# Patient Record
Sex: Female | Born: 1940 | Race: White | Hispanic: No | Marital: Married | Smoking: Never smoker
Health system: Southern US, Community
[De-identification: ages and names within clinical notes are randomized; demographics above are authoritative.]

## PROBLEM LIST (undated history)

## (undated) DIAGNOSIS — J189 Pneumonia, unspecified organism: Secondary | ICD-10-CM

## (undated) DIAGNOSIS — M1990 Unspecified osteoarthritis, unspecified site: Secondary | ICD-10-CM

## (undated) DIAGNOSIS — K5900 Constipation, unspecified: Secondary | ICD-10-CM

## (undated) DIAGNOSIS — I10 Essential (primary) hypertension: Secondary | ICD-10-CM

## (undated) DIAGNOSIS — K219 Gastro-esophageal reflux disease without esophagitis: Secondary | ICD-10-CM

## (undated) DIAGNOSIS — R600 Localized edema: Secondary | ICD-10-CM

## (undated) DIAGNOSIS — E785 Hyperlipidemia, unspecified: Secondary | ICD-10-CM

## (undated) DIAGNOSIS — J42 Unspecified chronic bronchitis: Secondary | ICD-10-CM

## (undated) DIAGNOSIS — R609 Edema, unspecified: Secondary | ICD-10-CM

## (undated) DIAGNOSIS — I739 Peripheral vascular disease, unspecified: Secondary | ICD-10-CM

## (undated) DIAGNOSIS — R6889 Other general symptoms and signs: Secondary | ICD-10-CM

## (undated) DIAGNOSIS — E119 Type 2 diabetes mellitus without complications: Secondary | ICD-10-CM

## (undated) HISTORY — PX: COLONOSCOPY: SHX174

## (undated) HISTORY — PX: DILATION AND CURETTAGE OF UTERUS: SHX78

---

## 1969-12-31 DIAGNOSIS — J189 Pneumonia, unspecified organism: Secondary | ICD-10-CM

## 1969-12-31 HISTORY — DX: Pneumonia, unspecified organism: J18.9

## 1988-12-31 HISTORY — PX: ABDOMINAL HYSTERECTOMY: SHX81

## 1998-06-21 ENCOUNTER — Other Ambulatory Visit: Admission: RE | Admit: 1998-06-21 | Discharge: 1998-06-21 | Payer: Self-pay | Admitting: Gynecology

## 1999-07-21 ENCOUNTER — Other Ambulatory Visit: Admission: RE | Admit: 1999-07-21 | Discharge: 1999-07-21 | Payer: Self-pay | Admitting: Gynecology

## 2000-08-07 ENCOUNTER — Other Ambulatory Visit: Admission: RE | Admit: 2000-08-07 | Discharge: 2000-08-07 | Payer: Self-pay | Admitting: Gynecology

## 2000-08-07 ENCOUNTER — Encounter: Admission: RE | Admit: 2000-08-07 | Discharge: 2000-08-07 | Payer: Self-pay | Admitting: Gynecology

## 2000-08-07 ENCOUNTER — Encounter: Payer: Self-pay | Admitting: Gynecology

## 2001-08-19 ENCOUNTER — Other Ambulatory Visit: Admission: RE | Admit: 2001-08-19 | Discharge: 2001-08-19 | Payer: Self-pay | Admitting: Gynecology

## 2001-08-19 ENCOUNTER — Encounter: Admission: RE | Admit: 2001-08-19 | Discharge: 2001-08-19 | Payer: Self-pay | Admitting: Gynecology

## 2001-08-19 ENCOUNTER — Encounter: Payer: Self-pay | Admitting: Gynecology

## 2002-09-03 ENCOUNTER — Encounter: Payer: Self-pay | Admitting: Gynecology

## 2002-09-03 ENCOUNTER — Other Ambulatory Visit: Admission: RE | Admit: 2002-09-03 | Discharge: 2002-09-03 | Payer: Self-pay | Admitting: Gynecology

## 2002-09-03 ENCOUNTER — Encounter: Admission: RE | Admit: 2002-09-03 | Discharge: 2002-09-03 | Payer: Self-pay | Admitting: Gynecology

## 2003-09-24 ENCOUNTER — Encounter: Admission: RE | Admit: 2003-09-24 | Discharge: 2003-09-24 | Payer: Self-pay | Admitting: Gynecology

## 2003-09-24 ENCOUNTER — Encounter: Payer: Self-pay | Admitting: Gynecology

## 2003-09-24 ENCOUNTER — Other Ambulatory Visit: Admission: RE | Admit: 2003-09-24 | Discharge: 2003-09-24 | Payer: Self-pay | Admitting: Gynecology

## 2004-10-02 ENCOUNTER — Other Ambulatory Visit: Admission: RE | Admit: 2004-10-02 | Discharge: 2004-10-02 | Payer: Self-pay | Admitting: Gynecology

## 2004-10-02 ENCOUNTER — Encounter: Admission: RE | Admit: 2004-10-02 | Discharge: 2004-10-02 | Payer: Self-pay | Admitting: Gynecology

## 2005-10-17 ENCOUNTER — Other Ambulatory Visit: Admission: RE | Admit: 2005-10-17 | Discharge: 2005-10-17 | Payer: Self-pay | Admitting: Gynecology

## 2005-10-17 ENCOUNTER — Encounter: Admission: RE | Admit: 2005-10-17 | Discharge: 2005-10-17 | Payer: Self-pay | Admitting: Gynecology

## 2006-10-28 ENCOUNTER — Other Ambulatory Visit: Admission: RE | Admit: 2006-10-28 | Discharge: 2006-10-28 | Payer: Self-pay | Admitting: Gynecology

## 2006-10-28 ENCOUNTER — Encounter: Admission: RE | Admit: 2006-10-28 | Discharge: 2006-10-28 | Payer: Self-pay | Admitting: Gynecology

## 2007-11-03 ENCOUNTER — Encounter: Admission: RE | Admit: 2007-11-03 | Discharge: 2007-11-03 | Payer: Self-pay | Admitting: Gynecology

## 2007-11-03 ENCOUNTER — Other Ambulatory Visit: Admission: RE | Admit: 2007-11-03 | Discharge: 2007-11-03 | Payer: Self-pay | Admitting: Gynecology

## 2008-12-13 ENCOUNTER — Encounter: Admission: RE | Admit: 2008-12-13 | Discharge: 2008-12-13 | Payer: Self-pay | Admitting: Gynecology

## 2010-01-02 ENCOUNTER — Encounter: Admission: RE | Admit: 2010-01-02 | Discharge: 2010-01-02 | Payer: Self-pay | Admitting: Gynecology

## 2011-01-15 ENCOUNTER — Encounter
Admission: RE | Admit: 2011-01-15 | Discharge: 2011-01-15 | Payer: Self-pay | Source: Home / Self Care | Attending: Gynecology | Admitting: Gynecology

## 2011-07-12 ENCOUNTER — Other Ambulatory Visit: Payer: Self-pay | Admitting: Internal Medicine

## 2011-07-12 DIAGNOSIS — I83899 Varicose veins of unspecified lower extremities with other complications: Secondary | ICD-10-CM

## 2011-07-18 ENCOUNTER — Ambulatory Visit
Admission: RE | Admit: 2011-07-18 | Discharge: 2011-07-18 | Disposition: A | Discharge status: Home / Self Care | Payer: Medicare Other | Source: Ambulatory Visit | Attending: Internal Medicine | Admitting: Internal Medicine

## 2011-07-18 VITALS — BP 160/80 | HR 71 | Temp 98.3°F | Resp 16 | Ht 61.0 in | Wt 199.0 lb

## 2011-07-18 DIAGNOSIS — I83899 Varicose veins of unspecified lower extremities with other complications: Secondary | ICD-10-CM

## 2011-07-18 HISTORY — DX: Localized edema: R60.0

## 2011-07-18 HISTORY — DX: Other general symptoms and signs: R68.89

## 2011-07-18 HISTORY — DX: Essential (primary) hypertension: I10

## 2011-07-18 HISTORY — DX: Edema, unspecified: R60.9

## 2011-07-18 HISTORY — DX: Hyperlipidemia, unspecified: E78.5

## 2012-01-21 DIAGNOSIS — E119 Type 2 diabetes mellitus without complications: Secondary | ICD-10-CM | POA: Diagnosis not present

## 2012-01-21 DIAGNOSIS — E785 Hyperlipidemia, unspecified: Secondary | ICD-10-CM | POA: Diagnosis not present

## 2012-01-21 DIAGNOSIS — Z79899 Other long term (current) drug therapy: Secondary | ICD-10-CM | POA: Diagnosis not present

## 2012-01-21 DIAGNOSIS — Z6836 Body mass index (BMI) 36.0-36.9, adult: Secondary | ICD-10-CM | POA: Diagnosis not present

## 2012-01-21 DIAGNOSIS — I872 Venous insufficiency (chronic) (peripheral): Secondary | ICD-10-CM | POA: Diagnosis not present

## 2012-01-21 DIAGNOSIS — I10 Essential (primary) hypertension: Secondary | ICD-10-CM | POA: Diagnosis not present

## 2012-01-31 ENCOUNTER — Other Ambulatory Visit: Payer: Self-pay | Admitting: Gynecology

## 2012-01-31 DIAGNOSIS — Z1231 Encounter for screening mammogram for malignant neoplasm of breast: Secondary | ICD-10-CM

## 2012-02-11 ENCOUNTER — Ambulatory Visit
Admission: RE | Admit: 2012-02-11 | Discharge: 2012-02-11 | Disposition: A | Discharge status: Home / Self Care | Payer: Medicare Other | Source: Ambulatory Visit | Attending: Gynecology | Admitting: Gynecology

## 2012-02-11 DIAGNOSIS — Z1231 Encounter for screening mammogram for malignant neoplasm of breast: Secondary | ICD-10-CM | POA: Diagnosis not present

## 2012-02-11 DIAGNOSIS — N8184 Pelvic muscle wasting: Secondary | ICD-10-CM | POA: Diagnosis not present

## 2012-02-11 DIAGNOSIS — Z01419 Encounter for gynecological examination (general) (routine) without abnormal findings: Secondary | ICD-10-CM | POA: Diagnosis not present

## 2012-02-11 DIAGNOSIS — M949 Disorder of cartilage, unspecified: Secondary | ICD-10-CM | POA: Diagnosis not present

## 2012-02-11 DIAGNOSIS — M899 Disorder of bone, unspecified: Secondary | ICD-10-CM | POA: Diagnosis not present

## 2012-02-11 DIAGNOSIS — E119 Type 2 diabetes mellitus without complications: Secondary | ICD-10-CM | POA: Diagnosis not present

## 2012-04-21 DIAGNOSIS — Z6836 Body mass index (BMI) 36.0-36.9, adult: Secondary | ICD-10-CM | POA: Diagnosis not present

## 2012-04-21 DIAGNOSIS — Z79899 Other long term (current) drug therapy: Secondary | ICD-10-CM | POA: Diagnosis not present

## 2012-04-21 DIAGNOSIS — E785 Hyperlipidemia, unspecified: Secondary | ICD-10-CM | POA: Diagnosis not present

## 2012-04-21 DIAGNOSIS — E119 Type 2 diabetes mellitus without complications: Secondary | ICD-10-CM | POA: Diagnosis not present

## 2012-04-21 DIAGNOSIS — I872 Venous insufficiency (chronic) (peripheral): Secondary | ICD-10-CM | POA: Diagnosis not present

## 2012-04-21 DIAGNOSIS — I10 Essential (primary) hypertension: Secondary | ICD-10-CM | POA: Diagnosis not present

## 2012-06-09 DIAGNOSIS — M25569 Pain in unspecified knee: Secondary | ICD-10-CM | POA: Diagnosis not present

## 2012-06-09 DIAGNOSIS — M1710 Unilateral primary osteoarthritis, unspecified knee: Secondary | ICD-10-CM | POA: Diagnosis not present

## 2012-06-23 DIAGNOSIS — M1710 Unilateral primary osteoarthritis, unspecified knee: Secondary | ICD-10-CM | POA: Diagnosis not present

## 2012-06-30 DIAGNOSIS — M1710 Unilateral primary osteoarthritis, unspecified knee: Secondary | ICD-10-CM | POA: Diagnosis not present

## 2012-06-30 DIAGNOSIS — M25559 Pain in unspecified hip: Secondary | ICD-10-CM | POA: Diagnosis not present

## 2012-07-07 DIAGNOSIS — M1710 Unilateral primary osteoarthritis, unspecified knee: Secondary | ICD-10-CM | POA: Diagnosis not present

## 2012-07-28 DIAGNOSIS — Z6837 Body mass index (BMI) 37.0-37.9, adult: Secondary | ICD-10-CM | POA: Diagnosis not present

## 2012-07-28 DIAGNOSIS — I872 Venous insufficiency (chronic) (peripheral): Secondary | ICD-10-CM | POA: Diagnosis not present

## 2012-07-28 DIAGNOSIS — I10 Essential (primary) hypertension: Secondary | ICD-10-CM | POA: Diagnosis not present

## 2012-07-28 DIAGNOSIS — E119 Type 2 diabetes mellitus without complications: Secondary | ICD-10-CM | POA: Diagnosis not present

## 2012-07-28 DIAGNOSIS — E785 Hyperlipidemia, unspecified: Secondary | ICD-10-CM | POA: Diagnosis not present

## 2012-08-11 DIAGNOSIS — M1610 Unilateral primary osteoarthritis, unspecified hip: Secondary | ICD-10-CM | POA: Diagnosis not present

## 2012-08-16 DIAGNOSIS — M169 Osteoarthritis of hip, unspecified: Secondary | ICD-10-CM | POA: Diagnosis not present

## 2012-08-18 ENCOUNTER — Other Ambulatory Visit: Payer: Self-pay | Admitting: Orthopedic Surgery

## 2012-08-29 ENCOUNTER — Encounter (HOSPITAL_COMMUNITY): Payer: Self-pay

## 2012-09-08 NOTE — Pre-Procedure Instructions (Signed)
20 Michele Stewart  09/08/2012   Your procedure is scheduled on:  Monday September 15, 2012.  Report to [facility] at 0800 AM.  Call this number if you have problems the morning of surgery: [phone]   Remember:   Do not eat food or drink:After Midnight.    Take these medicines the morning of surgery with A SIP OF WATER: Amlodipine (Norvasc), Bisoprolol (Ziac), Famotidine (Pepcid)   Do not wear jewelry, make-up or nail polish.  Do not wear lotions, powders, or perfumes.   Do not shave 48 hours prior to surgery.   Do not bring valuables to the hospital.  Contacts, dentures or bridgework may not be worn into surgery.  Leave suitcase in the car. After surgery it may be brought to your room.  For patients admitted to the hospital, checkout time is 11:00 AM the day of discharge.   Patients discharged the day of surgery will not be allowed to drive home.  Name and phone number of your driver:   Special Instructions: CHG Shower Use Special Wash: 1/2 bottle night before surgery and 1/2 bottle morning of surgery.   Please read over the following fact sheets that you were given: Pain Booklet, Coughing and Deep Breathing, Blood Transfusion Information, Total Joint Packet, MRSA Information and Surgical Site Infection Prevention

## 2012-09-09 ENCOUNTER — Encounter (HOSPITAL_COMMUNITY): Payer: Self-pay

## 2012-09-09 ENCOUNTER — Encounter (HOSPITAL_COMMUNITY)
Admission: RE | Admit: 2012-09-09 | Discharge: 2012-09-09 | Disposition: A | Discharge status: Home / Self Care | Payer: Medicare Other | Source: Ambulatory Visit | Attending: Orthopedic Surgery | Admitting: Orthopedic Surgery

## 2012-09-09 DIAGNOSIS — E785 Hyperlipidemia, unspecified: Secondary | ICD-10-CM | POA: Diagnosis not present

## 2012-09-09 DIAGNOSIS — Z01811 Encounter for preprocedural respiratory examination: Secondary | ICD-10-CM | POA: Diagnosis not present

## 2012-09-09 DIAGNOSIS — N390 Urinary tract infection, site not specified: Secondary | ICD-10-CM | POA: Diagnosis not present

## 2012-09-09 DIAGNOSIS — E119 Type 2 diabetes mellitus without complications: Secondary | ICD-10-CM | POA: Diagnosis not present

## 2012-09-09 DIAGNOSIS — M169 Osteoarthritis of hip, unspecified: Secondary | ICD-10-CM | POA: Diagnosis not present

## 2012-09-09 HISTORY — DX: Unspecified osteoarthritis, unspecified site: M19.90

## 2012-09-09 HISTORY — DX: Constipation, unspecified: K59.00

## 2012-09-09 HISTORY — DX: Pneumonia, unspecified organism: J18.9

## 2012-09-09 HISTORY — DX: Gastro-esophageal reflux disease without esophagitis: K21.9

## 2012-09-09 LAB — CBC WITH DIFFERENTIAL/PLATELET
Basophils Absolute: 0.1 10*3/uL (ref 0.0–0.1)
HCT: 35.6 % — ABNORMAL LOW (ref 36.0–46.0)
Hemoglobin: 12 g/dL (ref 12.0–15.0)
Lymphocytes Relative: 14 % (ref 12–46)
Monocytes Absolute: 0.8 10*3/uL (ref 0.1–1.0)
Monocytes Relative: 8 % (ref 3–12)
Neutro Abs: 6.8 10*3/uL (ref 1.7–7.7)
WBC: 9.8 10*3/uL (ref 4.0–10.5)

## 2012-09-09 LAB — URINALYSIS, ROUTINE W REFLEX MICROSCOPIC
Hgb urine dipstick: NEGATIVE
Protein, ur: NEGATIVE mg/dL
Urobilinogen, UA: 0.2 mg/dL (ref 0.0–1.0)

## 2012-09-09 LAB — TYPE AND SCREEN
ABO/RH(D): O POS
Antibody Screen: NEGATIVE

## 2012-09-09 LAB — SURGICAL PCR SCREEN: MRSA, PCR: NEGATIVE

## 2012-09-09 LAB — BASIC METABOLIC PANEL
Chloride: 100 mEq/L (ref 96–112)
GFR calc Af Amer: 45 mL/min — ABNORMAL LOW (ref >90)
Potassium: 3.4 mEq/L — ABNORMAL LOW (ref 3.5–5.1)

## 2012-09-09 LAB — ABO/RH: ABO/RH(D): O POS

## 2012-09-09 LAB — APTT: aPTT: 35 seconds (ref 24–37)

## 2012-09-09 NOTE — Progress Notes (Signed)
Patient informed Nurse that she had a stress test at [facility] in [place], [place]. Records requested. Patient denied having a cardiac cath or sleep study.

## 2012-09-12 NOTE — Progress Notes (Signed)
51 Am  ---Spoke with patient ---arriving at [facility] and understands.Marland KitchenMarland KitchenDA

## 2012-09-13 NOTE — H&P (Signed)
Michele Stewart is an 71 y.o. female.   Chief Complaint: Left hip pain HPI: Michele Stewart is here in consultation from Dr. 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  She is from [place] x-rays from a little over a year ago show very little arthritis, but more recent ones shot this year show complete loss of articular cartilage with subchondral cyst and early erosion of the femoral head.  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  Her hip pain wakes her up at night, causes her to limp all of the time and is starting to interfere with chores around the house, specifically cooking and cleaning.  She has received cortisone injections into the hip in the past year that it provided at best temporary relief.  Past Medical History  Diagnosis Date  . Diabetes mellitus   . Peripheral edema   . Hyperlipidemia   . Hypertension     Benign Essential Hypertension  . Cold intolerance   . Pneumonia     hx of  . Bronchitis   . GERD (gastroesophageal reflux disease)   . Constipation   . Arthritis     Past Surgical History  Procedure Date  . Abdominal hysterectomy 1990  . Cesarean section     2 c-sectons  . Colonoscopy     No family history on file. Social History:  reports that she has never smoked. She has never used smokeless tobacco. She reports that she does not drink alcohol or use illicit drugs.  Allergies:  Allergies  Allergen Reactions  . Penicillins Rash  . Shellfish Allergy Itching and Rash  . Sulfa Antibiotics Hives and Rash    No prescriptions prior to admission    No results found for this or any previous visit (from the past 48 hour(s)). No results found.  Review of Systems  Constitutional: Negative.   Musculoskeletal: Positive for joint pain.  All other systems reviewed and are negative.    There were no vitals taken for this visit. Physical Exam  Constitutional: She is oriented to  person, place, and time. She appears well-developed and well-nourished.  Eyes: Pupils are equal, round, and reactive to light.  Cardiovascular: Normal heart sounds.   Respiratory: Breath sounds normal.  GI: Soft.  Musculoskeletal: She exhibits tenderness.  Neurological: She is alert and oriented to person, place, and time.    Internal rotation of the left hip blocks at -5 and causes severe pain.  Her left knee lacks 10 of full extension, bringing her into extension also causes pain but much less than motion of the hip.  The x-rays are reviewed with the patient.  The skin over the hip in the near intact.  Although she is a non-insulin-dependent diabetic she has normal sensation to her feet and normal pulses.  Assessment/Plan Assess: Progressive osteoarthritis of the left hip with severe symptoms and x-ray showing bone-on-bone with subchondral cystic changes and early flattening of the femoral head.  Plan: Models were brought into the room and we had about a 30 min. conversation about the risks and benefits of hip replacement, the surgery itself, the expected postoperative course.  I filled out a surgery sheet for Michele Stewart to assist in scheduling surgery with the patient.  I will see her back at the time of surgical intervention.  Veryl Abril M. 09/13/2012, 9:05 AM

## 2012-09-14 MED ORDER — VANCOMYCIN HCL IN DEXTROSE 1-5 GM/200ML-% IV SOLN
1000.0000 mg | INTRAVENOUS | Status: AC
  Administered 2012-09-15: 1000 mg via INTRAVENOUS

## 2012-09-15 ENCOUNTER — Encounter (HOSPITAL_COMMUNITY): Payer: Self-pay | Admitting: Certified Registered"

## 2012-09-15 ENCOUNTER — Inpatient Hospital Stay (HOSPITAL_COMMUNITY)
Admission: RE | Admit: 2012-09-15 | Discharge: 2012-09-17 | DRG: 470 | Disposition: A | Discharge status: Home Health | Payer: Medicare Other | Source: Ambulatory Visit | Attending: Orthopedic Surgery | Admitting: Orthopedic Surgery

## 2012-09-15 ENCOUNTER — Inpatient Hospital Stay (HOSPITAL_COMMUNITY): Payer: Medicare Other

## 2012-09-15 ENCOUNTER — Encounter (HOSPITAL_COMMUNITY): Payer: Self-pay | Admitting: *Deleted

## 2012-09-15 ENCOUNTER — Inpatient Hospital Stay (HOSPITAL_COMMUNITY): Payer: Medicare Other | Admitting: Certified Registered"

## 2012-09-15 ENCOUNTER — Encounter (HOSPITAL_COMMUNITY)
Admission: RE | Disposition: A | Discharge status: Home Health | Payer: Self-pay | Source: Ambulatory Visit | Attending: Orthopedic Surgery

## 2012-09-15 DIAGNOSIS — E785 Hyperlipidemia, unspecified: Secondary | ICD-10-CM | POA: Diagnosis not present

## 2012-09-15 DIAGNOSIS — M1610 Unilateral primary osteoarthritis, unspecified hip: Secondary | ICD-10-CM | POA: Diagnosis not present

## 2012-09-15 DIAGNOSIS — Z6836 Body mass index (BMI) 36.0-36.9, adult: Secondary | ICD-10-CM | POA: Diagnosis not present

## 2012-09-15 DIAGNOSIS — I10 Essential (primary) hypertension: Secondary | ICD-10-CM | POA: Diagnosis present

## 2012-09-15 DIAGNOSIS — M169 Osteoarthritis of hip, unspecified: Secondary | ICD-10-CM | POA: Diagnosis not present

## 2012-09-15 DIAGNOSIS — Z96649 Presence of unspecified artificial hip joint: Secondary | ICD-10-CM | POA: Diagnosis not present

## 2012-09-15 DIAGNOSIS — K219 Gastro-esophageal reflux disease without esophagitis: Secondary | ICD-10-CM | POA: Diagnosis not present

## 2012-09-15 DIAGNOSIS — M1612 Unilateral primary osteoarthritis, left hip: Secondary | ICD-10-CM | POA: Diagnosis present

## 2012-09-15 DIAGNOSIS — M25559 Pain in unspecified hip: Secondary | ICD-10-CM | POA: Diagnosis not present

## 2012-09-15 DIAGNOSIS — Z471 Aftercare following joint replacement surgery: Secondary | ICD-10-CM | POA: Diagnosis not present

## 2012-09-15 DIAGNOSIS — N390 Urinary tract infection, site not specified: Secondary | ICD-10-CM | POA: Diagnosis not present

## 2012-09-15 DIAGNOSIS — E119 Type 2 diabetes mellitus without complications: Secondary | ICD-10-CM | POA: Diagnosis not present

## 2012-09-15 DIAGNOSIS — E669 Obesity, unspecified: Secondary | ICD-10-CM | POA: Diagnosis present

## 2012-09-15 HISTORY — PX: TOTAL HIP ARTHROPLASTY: SHX124

## 2012-09-15 HISTORY — DX: Type 2 diabetes mellitus without complications: E11.9

## 2012-09-15 HISTORY — DX: Unspecified chronic bronchitis: J42

## 2012-09-15 HISTORY — DX: Peripheral vascular disease, unspecified: I73.9

## 2012-09-15 LAB — GLUCOSE, CAPILLARY
Glucose-Capillary: 122 mg/dL — ABNORMAL HIGH (ref 70–99)
Glucose-Capillary: 167 mg/dL — ABNORMAL HIGH (ref 70–99)
Glucose-Capillary: 245 mg/dL — ABNORMAL HIGH (ref 70–99)

## 2012-09-15 SURGERY — ARTHROPLASTY, HIP, TOTAL,POSTERIOR APPROACH
Anesthesia: General | Site: Hip | Laterality: Left | Wound class: Clean

## 2012-09-15 MED ORDER — LORATADINE 10 MG PO TABS
10.0000 mg | ORAL_TABLET | Freq: Every day | ORAL | Status: DC
  Administered 2012-09-15 – 2012-09-17 (×3): 10 mg via ORAL
  Filled 2012-09-15 (×3): qty 1

## 2012-09-15 MED ORDER — ONDANSETRON HCL 4 MG/2ML IJ SOLN: 4.0000 mg | Freq: Four times a day (QID) | INTRAMUSCULAR | Status: DC | PRN

## 2012-09-15 MED ORDER — LINAGLIPTIN 5 MG PO TABS
5.0000 mg | ORAL_TABLET | Freq: Every day | ORAL | Status: DC
  Administered 2012-09-15 – 2012-09-17 (×3): 5 mg via ORAL
  Filled 2012-09-15 (×3): qty 1

## 2012-09-15 MED ORDER — PSYLLIUM 0.52 G PO CAPS
0.5200 g | ORAL_CAPSULE | Freq: Three times a day (TID) | ORAL | Status: DC
Start: 2012-09-15 — End: 2012-09-15

## 2012-09-15 MED ORDER — LIDOCAINE HCL (CARDIAC) 20 MG/ML IV SOLN
INTRAVENOUS | Status: DC | PRN
  Administered 2012-09-15: 100 mg via INTRAVENOUS

## 2012-09-15 MED ORDER — BISOPROLOL-HYDROCHLOROTHIAZIDE 10-6.25 MG PO TABS
1.0000 | ORAL_TABLET | Freq: Every day | ORAL | Status: DC
  Administered 2012-09-16: 1 via ORAL
  Filled 2012-09-15 (×3): qty 1

## 2012-09-15 MED ORDER — EPHEDRINE SULFATE 50 MG/ML IJ SOLN
INTRAMUSCULAR | Status: DC | PRN
  Administered 2012-09-15: 10 mg via INTRAVENOUS
  Administered 2012-09-15: 5 mg via INTRAVENOUS
  Administered 2012-09-15: 10 mg via INTRAVENOUS

## 2012-09-15 MED ORDER — BISACODYL 5 MG PO TBEC: 5.0000 mg | DELAYED_RELEASE_TABLET | Freq: Every day | ORAL | Status: DC | PRN

## 2012-09-15 MED ORDER — PHENOL 1.4 % MT LIQD: 1.0000 | OROMUCOSAL | Status: DC | PRN

## 2012-09-15 MED ORDER — MAGNESIUM HYDROXIDE 400 MG/5ML PO SUSP: 30.0000 mL | Freq: Every day | ORAL | Status: DC | PRN

## 2012-09-15 MED ORDER — SUFENTANIL CITRATE 50 MCG/ML IV SOLN
INTRAVENOUS | Status: DC | PRN
  Administered 2012-09-15 (×2): 10 ug via INTRAVENOUS

## 2012-09-15 MED ORDER — AMLODIPINE BESYLATE 10 MG PO TABS
10.0000 mg | ORAL_TABLET | Freq: Every day | ORAL | Status: DC
  Administered 2012-09-16: 10 mg via ORAL
  Filled 2012-09-15 (×3): qty 1

## 2012-09-15 MED ORDER — ATORVASTATIN CALCIUM 40 MG PO TABS
40.0000 mg | ORAL_TABLET | Freq: Every day | ORAL | Status: DC
  Administered 2012-09-15 – 2012-09-16 (×2): 40 mg via ORAL
  Filled 2012-09-15 (×3): qty 1

## 2012-09-15 MED ORDER — METHOCARBAMOL 500 MG PO TABS
500.0000 mg | ORAL_TABLET | Freq: Four times a day (QID) | ORAL | Status: DC | PRN
  Administered 2012-09-15: 500 mg via ORAL

## 2012-09-15 MED ORDER — ACETAMINOPHEN 325 MG PO TABS: 650.0000 mg | ORAL_TABLET | Freq: Four times a day (QID) | ORAL | Status: DC | PRN

## 2012-09-15 MED ORDER — FUROSEMIDE 80 MG PO TABS
80.0000 mg | ORAL_TABLET | Freq: Every day | ORAL | Status: DC
  Administered 2012-09-15 – 2012-09-16 (×2): 80 mg via ORAL
  Filled 2012-09-15 (×3): qty 1

## 2012-09-15 MED ORDER — ACETAMINOPHEN 10 MG/ML IV SOLN
INTRAVENOUS | Status: DC | PRN
  Administered 2012-09-15: 1000 mg via INTRAVENOUS

## 2012-09-15 MED ORDER — ACETAMINOPHEN 650 MG RE SUPP: 650.0000 mg | Freq: Four times a day (QID) | RECTAL | Status: DC | PRN

## 2012-09-15 MED ORDER — ASPIRIN EC 325 MG PO TBEC
325.0000 mg | DELAYED_RELEASE_TABLET | Freq: Two times a day (BID) | ORAL | Status: DC
  Administered 2012-09-15 – 2012-09-17 (×5): 325 mg via ORAL
  Filled 2012-09-15 (×6): qty 1

## 2012-09-15 MED ORDER — ROCURONIUM BROMIDE 100 MG/10ML IV SOLN
INTRAVENOUS | Status: DC | PRN
  Administered 2012-09-15: 50 mg via INTRAVENOUS

## 2012-09-15 MED ORDER — IRBESARTAN 300 MG PO TABS
300.0000 mg | ORAL_TABLET | Freq: Every day | ORAL | Status: DC
  Administered 2012-09-15 – 2012-09-16 (×2): 300 mg via ORAL
  Filled 2012-09-15 (×3): qty 1

## 2012-09-15 MED ORDER — HYDROMORPHONE HCL PF 1 MG/ML IJ SOLN
0.2500 mg | INTRAMUSCULAR | Status: DC | PRN
  Administered 2012-09-15 (×2): 0.5 mg via INTRAVENOUS

## 2012-09-15 MED ORDER — LISINOPRIL 20 MG PO TABS
20.0000 mg | ORAL_TABLET | Freq: Every day | ORAL | Status: DC
  Filled 2012-09-15: qty 1

## 2012-09-15 MED ORDER — OXYCODONE HCL 5 MG PO TABS
ORAL_TABLET | ORAL | Status: AC
  Filled 2012-09-15: qty 2

## 2012-09-15 MED ORDER — DEXTROSE-NACL 5-0.45 % IV SOLN: INTRAVENOUS | Status: DC

## 2012-09-15 MED ORDER — LIDOCAINE HCL 4 % MT SOLN
OROMUCOSAL | Status: DC | PRN
  Administered 2012-09-15: 4 mL via TOPICAL

## 2012-09-15 MED ORDER — HYDROMORPHONE HCL PF 1 MG/ML IJ SOLN
INTRAMUSCULAR | Status: AC
  Filled 2012-09-15: qty 1

## 2012-09-15 MED ORDER — OXYCODONE HCL 5 MG PO TABS
5.0000 mg | ORAL_TABLET | ORAL | Status: DC | PRN
  Administered 2012-09-15: 5 mg via ORAL
  Administered 2012-09-15: 10 mg via ORAL
  Administered 2012-09-16 – 2012-09-17 (×3): 5 mg via ORAL
  Filled 2012-09-15: qty 1
  Filled 2012-09-15: qty 2
  Filled 2012-09-15 (×2): qty 1

## 2012-09-15 MED ORDER — ALUM & MAG HYDROXIDE-SIMETH 200-200-20 MG/5ML PO SUSP: 30.0000 mL | ORAL | Status: DC | PRN

## 2012-09-15 MED ORDER — ACETAMINOPHEN 10 MG/ML IV SOLN
INTRAVENOUS | Status: AC
  Filled 2012-09-15: qty 100

## 2012-09-15 MED ORDER — CHLORHEXIDINE GLUCONATE 4 % EX LIQD: 60.0000 mL | Freq: Once | CUTANEOUS | Status: DC

## 2012-09-15 MED ORDER — MENTHOL 3 MG MT LOZG: 1.0000 | LOZENGE | OROMUCOSAL | Status: DC | PRN

## 2012-09-15 MED ORDER — METHOCARBAMOL 100 MG/ML IJ SOLN
500.0000 mg | Freq: Four times a day (QID) | INTRAVENOUS | Status: DC | PRN
  Filled 2012-09-15: qty 5

## 2012-09-15 MED ORDER — BUPIVACAINE-EPINEPHRINE 0.5% -1:200000 IJ SOLN
INTRAMUSCULAR | Status: DC | PRN
  Administered 2012-09-15: 24 mL

## 2012-09-15 MED ORDER — POTASSIUM CHLORIDE CRYS ER 10 MEQ PO TBCR
10.0000 meq | EXTENDED_RELEASE_TABLET | Freq: Two times a day (BID) | ORAL | Status: DC
  Administered 2012-09-15 – 2012-09-17 (×4): 10 meq via ORAL
  Filled 2012-09-15 (×5): qty 1

## 2012-09-15 MED ORDER — KCL IN DEXTROSE-NACL 20-5-0.45 MEQ/L-%-% IV SOLN
INTRAVENOUS | Status: DC
  Administered 2012-09-15 – 2012-09-16 (×2): via INTRAVENOUS
  Filled 2012-09-15 (×8): qty 1000

## 2012-09-15 MED ORDER — LACTATED RINGERS IV SOLN
INTRAVENOUS | Status: DC | PRN
  Administered 2012-09-15 (×2): via INTRAVENOUS

## 2012-09-15 MED ORDER — LISINOPRIL 20 MG PO TABS
20.0000 mg | ORAL_TABLET | Freq: Every day | ORAL | Status: DC
  Administered 2012-09-15: 20 mg via ORAL
  Filled 2012-09-15 (×3): qty 1

## 2012-09-15 MED ORDER — ONDANSETRON HCL 4 MG PO TABS: 4.0000 mg | ORAL_TABLET | Freq: Four times a day (QID) | ORAL | Status: DC | PRN

## 2012-09-15 MED ORDER — DIPHENHYDRAMINE HCL 12.5 MG/5ML PO ELIX: 12.5000 mg | ORAL_SOLUTION | ORAL | Status: DC | PRN

## 2012-09-15 MED ORDER — PSYLLIUM 95 % PO PACK
1.0000 | PACK | Freq: Three times a day (TID) | ORAL | Status: DC
  Administered 2012-09-15 – 2012-09-17 (×4): 1 via ORAL
  Filled 2012-09-15 (×8): qty 1

## 2012-09-15 MED ORDER — METOCLOPRAMIDE HCL 10 MG PO TABS: 5.0000 mg | ORAL_TABLET | Freq: Three times a day (TID) | ORAL | Status: DC | PRN

## 2012-09-15 MED ORDER — ESTRADIOL 0.05 MG/24HR TD PTWK
0.0500 mg | MEDICATED_PATCH | TRANSDERMAL | Status: DC
  Administered 2012-09-15: 0.05 mg via TRANSDERMAL
  Filled 2012-09-15: qty 1

## 2012-09-15 MED ORDER — INFLUENZA VIRUS VACC SPLIT PF IM SUSP
0.5000 mL | INTRAMUSCULAR | Status: AC
  Filled 2012-09-15: qty 0.5

## 2012-09-15 MED ORDER — INSULIN ASPART 100 UNIT/ML ~~LOC~~ SOLN
0.0000 [IU] | Freq: Three times a day (TID) | SUBCUTANEOUS | Status: DC
  Administered 2012-09-15 – 2012-09-16 (×2): 3 [IU] via SUBCUTANEOUS
  Administered 2012-09-16: 2 [IU] via SUBCUTANEOUS
  Administered 2012-09-16 – 2012-09-17 (×2): 3 [IU] via SUBCUTANEOUS

## 2012-09-15 MED ORDER — BISOPROLOL-HYDROCHLOROTHIAZIDE 10-6.25 MG PO TABS: 1.0000 | ORAL_TABLET | Freq: Every day | ORAL | Status: DC

## 2012-09-15 MED ORDER — MIDAZOLAM HCL 5 MG/5ML IJ SOLN
INTRAMUSCULAR | Status: DC | PRN
  Administered 2012-09-15: 2 mg via INTRAVENOUS

## 2012-09-15 MED ORDER — FAMOTIDINE 20 MG PO TABS
20.0000 mg | ORAL_TABLET | Freq: Every day | ORAL | Status: DC
  Administered 2012-09-16 – 2012-09-17 (×2): 20 mg via ORAL
  Filled 2012-09-15 (×2): qty 1

## 2012-09-15 MED ORDER — PNEUMOCOCCAL VAC POLYVALENT 25 MCG/0.5ML IJ INJ
0.5000 mL | INJECTION | INTRAMUSCULAR | Status: AC
  Filled 2012-09-15: qty 0.5

## 2012-09-15 MED ORDER — METOCLOPRAMIDE HCL 5 MG/ML IJ SOLN: 5.0000 mg | Freq: Three times a day (TID) | INTRAMUSCULAR | Status: DC | PRN

## 2012-09-15 MED ORDER — BUPIVACAINE-EPINEPHRINE (PF) 0.5% -1:200000 IJ SOLN
INTRAMUSCULAR | Status: AC
Start: 2012-09-15 — End: 2012-09-15
  Filled 2012-09-15: qty 10

## 2012-09-15 MED ORDER — ACETAMINOPHEN 10 MG/ML IV SOLN
1000.0000 mg | Freq: Four times a day (QID) | INTRAVENOUS | Status: AC
  Administered 2012-09-15 – 2012-09-16 (×4): 1000 mg via INTRAVENOUS
  Filled 2012-09-15 (×4): qty 100

## 2012-09-15 MED ORDER — GEMFIBROZIL 600 MG PO TABS
600.0000 mg | ORAL_TABLET | Freq: Every day | ORAL | Status: DC
  Administered 2012-09-15 – 2012-09-16 (×2): 600 mg via ORAL
  Filled 2012-09-15 (×3): qty 1

## 2012-09-15 MED ORDER — GEMFIBROZIL 600 MG PO TABS
600.0000 mg | ORAL_TABLET | Freq: Every day | ORAL | Status: DC
  Filled 2012-09-15: qty 1

## 2012-09-15 MED ORDER — HYDROMORPHONE HCL PF 1 MG/ML IJ SOLN: 1.0000 mg | INTRAMUSCULAR | Status: DC | PRN

## 2012-09-15 MED ORDER — METHOCARBAMOL 500 MG PO TABS
ORAL_TABLET | ORAL | Status: AC
  Filled 2012-09-15: qty 1

## 2012-09-15 MED ORDER — FLEET ENEMA 7-19 GM/118ML RE ENEM: 1.0000 | ENEMA | Freq: Once | RECTAL | Status: AC | PRN

## 2012-09-15 MED ORDER — PROPOFOL 10 MG/ML IV BOLUS
INTRAVENOUS | Status: DC | PRN
  Administered 2012-09-15: 150 mg via INTRAVENOUS

## 2012-09-15 MED ORDER — DROPERIDOL 2.5 MG/ML IJ SOLN
INTRAMUSCULAR | Status: DC | PRN
  Administered 2012-09-15: 0.625 mg via INTRAVENOUS

## 2012-09-15 MED ORDER — ONDANSETRON HCL 4 MG/2ML IJ SOLN
INTRAMUSCULAR | Status: DC | PRN
  Administered 2012-09-15: 4 mg via INTRAVENOUS

## 2012-09-15 MED ORDER — ZOLPIDEM TARTRATE 5 MG PO TABS: 5.0000 mg | ORAL_TABLET | Freq: Every evening | ORAL | Status: DC | PRN

## 2012-09-15 MED ORDER — DEXAMETHASONE SODIUM PHOSPHATE 4 MG/ML IJ SOLN
INTRAMUSCULAR | Status: DC | PRN
  Administered 2012-09-15: 4 mg via INTRAVENOUS

## 2012-09-15 MED ORDER — ATORVASTATIN CALCIUM 40 MG PO TABS
40.0000 mg | ORAL_TABLET | Freq: Every day | ORAL | Status: DC
  Filled 2012-09-15: qty 1

## 2012-09-15 MED ORDER — SODIUM CHLORIDE 0.9 % IR SOLN
Status: DC | PRN
  Administered 2012-09-15: 1000 mL

## 2012-09-15 SURGICAL SUPPLY — 53 items
BLADE SAW SAG 73X25 THK (BLADE) ×1
BLADE SAW SGTL 18X1.27X75 (BLADE) IMPLANT
BLADE SAW SGTL 73X25 THK (BLADE) ×1 IMPLANT
BLADE SAW SGTL MED 73X18.5 STR (BLADE) IMPLANT
BRUSH FEMORAL CANAL (MISCELLANEOUS) IMPLANT
CLOTH BEACON ORANGE TIMEOUT ST (SAFETY) ×2 IMPLANT
COVER BACK TABLE 24X17X13 BIG (DRAPES) IMPLANT
COVER SURGICAL LIGHT HANDLE (MISCELLANEOUS) ×3 IMPLANT
DRAPE ORTHO SPLIT 77X108 STRL (DRAPES) ×2
DRAPE PROXIMA HALF (DRAPES) ×2 IMPLANT
DRAPE SURG ORHT 6 SPLT 77X108 (DRAPES) ×1 IMPLANT
DRAPE U-SHAPE 47X51 STRL (DRAPES) ×2 IMPLANT
DRILL BIT 7/64X5 (BIT) ×2 IMPLANT
DRSG MEPILEX BORDER 4X12 (GAUZE/BANDAGES/DRESSINGS) ×2 IMPLANT
DRSG MEPILEX BORDER 4X8 (GAUZE/BANDAGES/DRESSINGS) ×2 IMPLANT
DURAPREP 26ML APPLICATOR (WOUND CARE) ×2 IMPLANT
ELECT BLADE 4.0 EZ CLEAN MEGAD (MISCELLANEOUS) ×2
ELECT REM PT RETURN 9FT ADLT (ELECTROSURGICAL) ×2
ELECTRODE BLDE 4.0 EZ CLN MEGD (MISCELLANEOUS) IMPLANT
ELECTRODE REM PT RTRN 9FT ADLT (ELECTROSURGICAL) ×1 IMPLANT
GAUZE XEROFORM 1X8 LF (GAUZE/BANDAGES/DRESSINGS) ×2 IMPLANT
GLOVE BIO SURGEON STRL SZ7 (GLOVE) ×2 IMPLANT
GLOVE BIO SURGEON STRL SZ7.5 (GLOVE) ×2 IMPLANT
GLOVE BIOGEL PI IND STRL 7.0 (GLOVE) ×1 IMPLANT
GLOVE BIOGEL PI IND STRL 8 (GLOVE) ×1 IMPLANT
GLOVE BIOGEL PI INDICATOR 7.0 (GLOVE) ×1
GLOVE BIOGEL PI INDICATOR 8 (GLOVE) ×1
GOWN PREVENTION PLUS XLARGE (GOWN DISPOSABLE) ×2 IMPLANT
GOWN STRL NON-REIN LRG LVL3 (GOWN DISPOSABLE) ×4 IMPLANT
HANDPIECE INTERPULSE COAX TIP (DISPOSABLE)
HOOD PEEL AWAY FACE SHEILD DIS (HOOD) ×4 IMPLANT
KIT BASIN OR (CUSTOM PROCEDURE TRAY) ×2 IMPLANT
KIT ROOM TURNOVER OR (KITS) ×2 IMPLANT
MANIFOLD NEPTUNE II (INSTRUMENTS) ×2 IMPLANT
NEEDLE 22X1 1/2 (OR ONLY) (NEEDLE) ×2 IMPLANT
NS IRRIG 1000ML POUR BTL (IV SOLUTION) ×2 IMPLANT
PACK TOTAL JOINT (CUSTOM PROCEDURE TRAY) ×2 IMPLANT
PAD ARMBOARD 7.5X6 YLW CONV (MISCELLANEOUS) ×4 IMPLANT
PASSER SUT SWANSON 36MM LOOP (INSTRUMENTS) ×2 IMPLANT
PRESSURIZER FEMORAL UNIV (MISCELLANEOUS) IMPLANT
SET HNDPC FAN SPRY TIP SCT (DISPOSABLE) IMPLANT
SUT ETHIBOND 2 V 37 (SUTURE) ×2 IMPLANT
SUT ETHILON 3 0 FSL (SUTURE) ×2 IMPLANT
SUT VIC AB 0 CTB1 27 (SUTURE) ×2 IMPLANT
SUT VIC AB 1 CTX 36 (SUTURE) ×2
SUT VIC AB 1 CTX36XBRD ANBCTR (SUTURE) ×1 IMPLANT
SUT VIC AB 2-0 CTB1 (SUTURE) ×2 IMPLANT
SYR CONTROL 10ML LL (SYRINGE) ×2 IMPLANT
TOWEL OR 17X24 6PK STRL BLUE (TOWEL DISPOSABLE) ×2 IMPLANT
TOWEL OR 17X26 10 PK STRL BLUE (TOWEL DISPOSABLE) ×2 IMPLANT
TOWER CARTRIDGE SMART MIX (DISPOSABLE) IMPLANT
TRAY FOLEY CATH 14FR (SET/KITS/TRAYS/PACK) ×1 IMPLANT
WATER STERILE IRR 1000ML POUR (IV SOLUTION) ×8 IMPLANT

## 2012-09-15 NOTE — Transfer of Care (Signed)
Immediate Anesthesia Transfer of Care Note  Patient: Michele Stewart  Procedure(s) Performed: Procedure(s) (LRB) with comments: TOTAL HIP ARTHROPLASTY (Left) - left total hip arthroplasty  Patient Location: PACU  Anesthesia Type: General  Level of Consciousness: oriented, sedated, patient cooperative and responds to stimulation  Airway & Oxygen Therapy: Patient Spontanous Breathing and Patient connected to nasal cannula oxygen  Post-op Assessment: Report given to PACU RN, Post -op Vital signs reviewed and stable and Patient moving all extremities  Post vital signs: Reviewed and stable  Complications: No apparent anesthesia complications

## 2012-09-15 NOTE — Progress Notes (Signed)
Orthopedic Tech Progress Note Patient Details:  Michele Stewart 16-Aug-1941 [id number]  Patient ID: Michele Stewart, female   DOB: Aug 14, 1941, 72 y.o.   MRN: [id number]   Michele Stewart 09/15/2012, 2:28 PM Trapeze bar

## 2012-09-15 NOTE — Anesthesia Postprocedure Evaluation (Signed)
Anesthesia Post Note  Patient: Michele Stewart  Procedure(s) Performed: Procedure(s) (LRB): TOTAL HIP ARTHROPLASTY (Left)  Anesthesia type: General  Patient location: PACU  Post pain: Pain level controlled and Adequate analgesia  Post assessment: Post-op Vital signs reviewed, Patient's Cardiovascular Status Stable, Respiratory Function Stable, Patent Airway and Pain level controlled  Last Vitals:  Filed Vitals:   09/15/12 1151  BP:   Pulse: 68  Temp: 36.6 C  Resp: 12    Post vital signs: Reviewed and stable  Level of consciousness: awake, alert  and oriented  Complications: No apparent anesthesia complications

## 2012-09-15 NOTE — Anesthesia Preprocedure Evaluation (Addendum)
Anesthesia Evaluation  Patient identified by MRN, date of birth, ID band Patient awake    Reviewed: Allergy & Precautions, H&P , NPO status , Patient's Chart, lab work & pertinent test results  Airway Mallampati: II  Neck ROM: full    Dental  (+) Teeth Intact and Caps   Pulmonary          Cardiovascular hypertension, On Medications     Neuro/Psych negative neurological ROS     GI/Hepatic Neg liver ROS, GERD-  Medicated and Controlled,  Endo/Other  diabetes, Type 2, Oral Hypoglycemic Agentsobese  Renal/GU negative Renal ROS  negative genitourinary   Musculoskeletal  (+) Arthritis -,   Abdominal   Peds  Hematology negative hematology ROS (+)   Anesthesia Other Findings   Reproductive/Obstetrics                          Anesthesia Physical Anesthesia Plan  ASA: III  Anesthesia Plan: General   Post-op Pain Management:    Induction: Intravenous  Airway Management Planned: Oral ETT  Additional Equipment:   Intra-op Plan:   Post-operative Plan: Extubation in OR  Informed Consent: I have reviewed the patients History and Physical, chart, labs and discussed the procedure including the risks, benefits and alternatives for the proposed anesthesia with the patient or authorized representative who has indicated his/her understanding and acceptance.   Dental advisory given  Plan Discussed with: CRNA, Surgeon and Anesthesiologist  Anesthesia Plan Comments:        Anesthesia Quick Evaluation

## 2012-09-15 NOTE — Interval H&P Note (Signed)
History and Physical Interval Note:  09/15/2012 8:52 AM  Michele Stewart  has presented today for surgery, with the diagnosis of OSTEOARTHRITIS LEFT HIP  The various methods of treatment have been discussed with the patient and family. After consideration of risks, benefits and other options for treatment, the patient has consented to  Procedure(s) (LRB) with comments: TOTAL HIP ARTHROPLASTY (Left) - left total hip arthroplasty as a surgical intervention .  The patient's history has been reviewed, patient examined, no change in status, stable for surgery.  I have reviewed the patient's chart and labs.  Questions were answered to the patient's satisfaction.     Nestor Lewandowsky

## 2012-09-15 NOTE — Op Note (Signed)
OPERATIVE REPORT    DATE OF PROCEDURE:  09/15/2012       PREOPERATIVE DIAGNOSIS:  OSTEOARTHRITIS LEFT HIP                                                          POSTOPERATIVE DIAGNOSIS:  OSTEOARTHRITIS LEFT HIP                                                           PROCEDURE:  L total hip arthroplasty using a 50 mm DePuy Pinnacle  Cup, [employer], 10-degree polyethylene liner index superior  and posterior, a +3 32 mm ceramic head, a 16x11x150x35 SROM stem, 16DL Cone   SURGEON: Zeta Bucy J    ASSISTANT:   Mauricia Area, PA-C  (present throughout entire procedure and necessary for timely completion of the procedure)   ANESTHESIA: General BLOOD LOSS: 400 FLUID REPLACEMENT: 1800 crystalloid DRAINS: Foley Catheter URINE OUTPUT: 300cc COMPLICATIONS: none    INDICATIONS FOR PROCEDURE: A 71 y.o. year-old With  OSTEOARTHRITIS LEFT HIP   for 5 years, x-rays show bone-on-bone arthritic changes. Despite conservative measures with observation, anti-inflammatory medicine, narcotics, use of a cane, has severe unremitting pain and can ambulate only a few blocks before resting.  Patient desires elective L total hip arthroplasty to decrease pain and increase function. The risks, benefits, and alternatives were discussed at length including but not limited to the risks of infection, bleeding, nerve injury, stiffness, blood clots, the need for revision surgery, cardiopulmonary complications, among others, and they were willing to proceed.y have been discussed. Questions answered.     PROCEDURE IN DETAIL: The patient was identified by armband,  received preoperative IV antibiotics in the holding area at [facility], taken to the operating room , appropriate anesthetic monitors  were attached and general endotracheal anesthesia induced. Foley catheter was inserted. Pt was rolled into the R lateral decubitus position and fixed there with a Stulberg Mark II pelvic clamp and the L  lower extremity was then prepped and draped  in the usual sterile fashion from the ankle to the hemipelvis. A time-out  procedure was performed. The skin along the lateral hip and thigh  infiltrated with 10 mL of 0.5% Marcaine and epinephrine solution. We  then made a posterolateral approach to the hip. With a #10 blade, 25 cm  incision through skin and subcutaneous tissue down to the level of the  IT band. Small bleeders were identified and cauterized. IT band cut in  line with skin incision exposing the greater trochanter. A Cobra retractor was placed between the gluteus minimus and the superior hip joint capsule, and a spiked Cobra between the quadratus femoris and the inferior hip joint capsule. This isolated the short  external rotators and piriformis tendons. These were tagged with a #2 Ethibond  suture and cut off their insertion on the intertrochanteric crest. The posterior  capsule was then developed into an acetabular-based flap from Posterior Superior off of the acetabulum out over the femoral neck and back posterior inferior to the acetabular rim. This flap was tagged with two #2 Ethibond sutures and retracted protecting  the sciatic nerve. This exposed the arthritic femoral head and osteophytes. The hip was then flexed and internally rotated, dislocating the femoral head and a standard neck cut performed 1 fingerbreadth above the lesser trochanter.  A spiked Cobra was placed in the cotyloid notch and a Hohmann retractor was then used to lever the femur anteriorly off of the anterior pelvic column. A posterior-inferior wing retractor was placed at the junction of the acetabulum and the ischium completing the acetabular exposure.We then removed the peripheral osteophytes and labrum from the acetabulum. We then reamed the acetabulum up to 49 mm with basket reamers obtaining good coverage in all quadrants, irrigated out with normal  saline solution and hammered into place a 50 mm pinnacle cup in  45  degrees of abduction and about 20 degrees of anteversion. More  peripheral osteophytes removed and a trial 10-degree liner placed with the  index superior-posterior. The hip was then flexed and internally rotated exposing the  proximal femur, which was entered with the initiating reamer followed by  the axial reamers up to a 11.5 mm full depth and 12mm partial depth. We then conically reamed to 16D to the correct depth for a 42 base neck. The calcar was milled to 16DL. A trial cone and stem was inserted in the 25 degrees anteversion, with a +0 36mm trial head. Trial reduction was then performed and excellent stability was noted with at 90 of flexion with 70 of internal rotation and then full extension with maximal external rotation. The hip could not be dislocated in full extension. The knee could easily flex  to about 130 degrees. We also stretched the abductors at this point,  because of the preexisting adductor contractures. All trial components  were then removed. The acetabulum was irrigated out with normal saline  solution. A titanium [facility] was then screwed into place  followed by a 10-degree polyethylene liner index superior-posterior. On  the femoral side a 16DL ZTT1 cone was hammered into place, followed by a 16x11x150x36 SROM stem in 25 degrees of anteversion. At this point, a +3 32 mm ceramic head was  hammered on the stem. The hip was reduced. We checked our stability  one more time and found to be excellent. The wound was once again  thoroughly irrigated out with normal saline solution pulse lavage. The  capsular flap and short external rotators were repaired back to the  intertrochanteric crest through drill holes with a #2 Ethibond suture.  The IT band was closed with running 1 Vicryl suture. The subcutaneous  tissue with 0 and 2-0 undyed Vicryl suture and the skin with running  interlocking 3-0 nylon suture. Dressing of Xeroform and Mepilex was  then applied.  The patient was then unclamped, rolled supine, awaken extubated and taken to recovery room without difficulty in stable condition.   Tahjanae Blankenburg J 09/15/2012, 10:16 AM

## 2012-09-15 NOTE — Plan of Care (Signed)
Problem: Consults Goal: Diagnosis- Total Joint Replacement Primary Total Hip LEFT     

## 2012-09-15 NOTE — Progress Notes (Signed)
UR COMPLETED  

## 2012-09-16 ENCOUNTER — Encounter (HOSPITAL_COMMUNITY): Payer: Self-pay | Admitting: General Practice

## 2012-09-16 LAB — BASIC METABOLIC PANEL
BUN: 21 mg/dL (ref 6–23)
Calcium: 8.6 mg/dL (ref 8.4–10.5)
Chloride: 98 mEq/L (ref 96–112)
Creatinine, Ser: 1.19 mg/dL — ABNORMAL HIGH (ref 0.50–1.10)
GFR calc Af Amer: 52 mL/min — ABNORMAL LOW (ref >90)
GFR calc non Af Amer: 45 mL/min — ABNORMAL LOW (ref >90)

## 2012-09-16 LAB — CBC
MCH: 31.3 pg (ref 26.0–34.0)
MCHC: 34 g/dL (ref 30.0–36.0)
MCV: 92.1 fL (ref 78.0–100.0)
Platelets: 229 10*3/uL (ref 150–400)
RDW: 12.2 % (ref 11.5–15.5)
WBC: 8.9 10*3/uL (ref 4.0–10.5)

## 2012-09-16 NOTE — Progress Notes (Signed)
Referral received for SNF. Chart reviewed and CSW has spoken with RNCM who indicates that patient is for DC to home with Home Health and DME.  CSW to sign off. Please re-consult if CSW needs arise.  Derricka Mertz T. Daizha Anand, LCSWA  [phone]  

## 2012-09-16 NOTE — Progress Notes (Signed)
Patient ID: Michele Stewart, female   DOB: [birth year], 71 y.o.   MRN: [id number] PATIENT ID: Michele Stewart  MRN: [id number]  DOB/AGE:  [birth year] / 71 y.o.  1 Day Post-Op Procedure(s) (LRB): TOTAL HIP ARTHROPLASTY (Left)    PROGRESS NOTE Subjective: Patient is alert, oriented,no Nausea, no Vomiting, yes passing gas, no Bowel Movement. Taking PO well. Denies SOB, Chest or Calf Pain. Using Incentive Spirometer, PAS in place. Ambulate wbat, patient has already sat up in bed  Patient reports pain as 3 on 0-10 scale  .    Objective: Vital signs in last 24 hours: Filed Vitals:   09/15/12 2131 09/16/12 0000 09/16/12 0146 09/16/12 0454  BP: 120/57  96/46 124/51  Pulse: 85  84 92  Temp: 97.9 F (36.6 C)  97.9 F (36.6 C) 97.9 F (36.6 C)  TempSrc:      Resp: 18 18 16 18   SpO2: 94% 97% 95% 94%      Intake/Output from previous day: I/O last 3 completed shifts: In: 1700 [P.O.:600; I.V.:1100] Out: 4000 [Urine:4000]   Intake/Output this shift:     LABORATORY DATA:  Basename 09/16/12 0717 09/15/12 2126 09/15/12 1616  WBC -- -- --  HGB -- -- --  HCT -- -- --  PLT -- -- --  NA -- -- --  K -- -- --  CL -- -- --  CO2 -- -- --  BUN -- -- --  CREATININE -- -- --  GLUCOSE -- -- --  GLUCAP 166* 245* 167*  INR -- -- --  CALCIUM -- -- --    Examination: Neurologically intact ABD soft Neurovascular intact Sensation intact distally Intact pulses distally Dorsiflexion/Plantar flexion intact Incision: scant drainage No cellulitis present Compartment soft} XR AP&Lat of hip shows well placed\fixed THA  Assessment:   1 Day Post-Op Procedure(s) (LRB): TOTAL HIP ARTHROPLASTY (Left) ADDITIONAL DIAGNOSIS:  Diabetes, obesity  Plan: PT/OT WBAT, THA  posterior precautions  DVT Prophylaxis: SCDx72 hrs, ASA 325 mg BID x 2 weeks  DISCHARGE PLAN: Home  DISCHARGE NEEDS: HHPT, HHRN, CPM, Walker and 3-in-1 comode seat

## 2012-09-16 NOTE — Progress Notes (Signed)
CARE MANAGEMENT NOTE 09/16/2012  Patient:  Michele Stewart, Michele Stewart   Account Number:  [id number]  Date Initiated:  09/16/2012  Documentation initiated by:  Vance Peper  Subjective/Objective Assessment:   71 yr old female s/p left total hip arthroplasty     Action/Plan:   CM spoke with patient and husband. Choice offered. Patient preoperatively setup with [facility], no changes. Patient has rolling walker and raised toilet seat with handles.   Anticipated DC Date:  09/17/2012   Anticipated DC Plan:  HOME W HOME HEALTH SERVICES      DC Planning Services  CM consult      [facility] Choice  HOME HEALTH   Choice offered to / List presented to:  C-1 Patient        HH arranged  HH-2 PT      [facility] agency  [facility]   Status of service:  Completed, signed off Medicare Important Message given?   (If response is "NO", the following Medicare IM given date fields will be blank) Date Medicare IM given:   Date Additional Medicare IM given:    Discharge Disposition:  HOME W HOME HEALTH SERVICES  Per UR Regulation:    If discussed at Long Length of Stay Meetings, dates discussed:    Comments:

## 2012-09-16 NOTE — Progress Notes (Signed)
PT Treatment Note:   09/16/12 1222  PT Visit Information  Last PT Received On 09/16/12  Assistance Needed +1  PT Time Calculation  PT Start Time 1115  PT Stop Time 1150  PT Time Calculation (min) 35 min  Subjective Data  Subjective "I am going to do whatever I have to do."  Patient Stated Goal Go home.  Precautions  Precautions Posterior Hip  Precaution Booklet Issued No  Precaution Comments Pt able to recall 3/3 posterior hip precautions.  Restrictions  Weight Bearing Restrictions Yes  LLE Weight Bearing WBAT  Cognition  Overall Cognitive Status Appears within functional limits for tasks assessed/performed  Arousal/Alertness Awake/alert  Orientation Level Appears intact for tasks assessed  Behavior During Session [facility] for tasks performed  Bed Mobility  Bed Mobility Supine to Sit  Supine to Sit 5: Supervision;HOB flat  Details for Bed Mobility Assistance Verbal cues to maintain and follow posterior hip precautions.  Transfers  Transfers Sit to Stand;Stand to Sit (2 trials.)  Sit to Stand 4: Min guard;With upper extremity assist;From bed;From chair/3-in-1  Stand to Sit 4: Min guard;With upper extremity assist;To chair/3-in-1;To bed  Details for Transfer Assistance Guarding for balance with cues for safest hand/left LE placement.  Ambulation/Gait  Ambulation/Gait Assistance 4: Min guard  Ambulation Distance (Feet) 160 Feet  Assistive device Rolling walker  Ambulation/Gait Assistance Details Guarding for balance with cues for tall extended posture and safety with RW.  Gait Pattern Step-to pattern;Decreased step length - left;Decreased stance time - left;Trunk flexed  Stairs No  Wheelchair Mobility  Wheelchair Mobility No  Balance  Balance Assessed No  Exercises  Exercises Total Joint  Total Joint Exercises  Ankle Circles/Pumps AROM;Left;20 reps;Supine  Quad Sets AROM;Left;10 reps;Supine  Heel Slides AROM;Left;10 reps;Supine  Short Arc Quad AROM;Left;10 reps;Supine    Hip ABduction/ADduction AROM;Left;10 reps;Supine  PT - End of Session  Equipment Utilized During Treatment Gait belt  Activity Tolerance Patient tolerated treatment well  Patient left in bed;with call bell/phone within reach;with family/visitor present (Sitting EOB.)  Nurse Communication Mobility status  PT - Assessment/Plan  Comments on Treatment Session Pt admitted s/p left THA and continues to progress with therapy.  Pt able to tolerate BID ambulation with increased distance/independence this pm.    PT Plan Discharge plan remains appropriate;Frequency remains appropriate  PT Frequency 7X/week  Follow Up Recommendations Home health PT  Equipment Recommended None recommended by PT  Acute Rehab PT Goals  PT Goal Formulation With patient/family  Time For Goal Achievement 09/23/12  Potential to Achieve Goals Good  PT Goal: Supine/Side to Sit - Progress Progressing toward goal  PT Goal: Sit to Stand - Progress Progressing toward goal  PT Goal: Stand to Sit - Progress Progressing toward goal  PT Goal: Ambulate - Progress Progressing toward goal  PT Goal: Perform Home Exercise Program - Progress Progressing toward goal  PT General Charges  $$ ACUTE PT VISIT 1 Procedure  PT Treatments  $Gait Training 8-22 mins  $Therapeutic Exercise 8-22 mins    Pain:  3/10 in left hip.  Pt repositioned.  09/16/2012 Cephus Shelling, PT, DPT [phone]

## 2012-09-16 NOTE — Evaluation (Signed)
Physical Therapy Evaluation Patient Details Name: Michele Stewart MRN: [id number] DOB: [birth year] Today's Date: 09/16/2012 Time: 4098-1191 PT Time Calculation (min): 21 min  PT Assessment / Plan / Recommendation Clinical Impression  Pt is a 71 y/o female admitted s/p left THA along with the below PT problem list.  Pt would benefit from acute PT to maximize independence to facilitate d/c home with HHPT.    PT Assessment  Patient needs continued PT services    Follow Up Recommendations  Home health PT    Barriers to Discharge None      Equipment Recommendations  None recommended by PT    Recommendations for Other Services     Frequency 7X/week    Precautions / Restrictions Precautions Precautions: Posterior Hip Precaution Booklet Issued: Yes (comment) Precaution Comments: Educated on 3/3 posterior hip precautions. Restrictions Weight Bearing Restrictions: Yes LLE Weight Bearing: Weight bearing as tolerated   Pertinent Vitals/Pain 8/10 in left hip.  Pt repositioned.      Mobility  Bed Mobility Bed Mobility: Supine to Sit Supine to Sit: 4: Min assist;HOB flat Details for Bed Mobility Assistance: Assist for left LE due to pain with cues to follow posterior hip precautions. Transfers Transfers: Sit to Stand;Stand to Sit Sit to Stand: 4: Min assist;With upper extremity assist;From bed Stand to Sit: 4: Min guard;With upper extremity assist;To chair/3-in-1 Details for Transfer Assistance: Assist for balance to off weight left LE due to pain.  Guarding to slow descent.  Cues for sequence and safest hand/left LE placement. Ambulation/Gait Ambulation/Gait Assistance: 4: Min assist Ambulation Distance (Feet): 80 Feet Assistive device: Rolling walker Ambulation/Gait Assistance Details: Assist for balance with cues for tall posture and safe sequence with RW. Gait Pattern: Step-to pattern;Decreased step length - left;Decreased stance time - left;Trunk flexed Stairs:  No Wheelchair Mobility Wheelchair Mobility: No    Exercises Total Joint Exercises Ankle Circles/Pumps: AROM;Left;20 reps;Supine Quad Sets: AROM;Left;10 reps;Supine Heel Slides: AROM;Left;10 reps;Supine   PT Diagnosis: Difficulty walking;Acute pain  PT Problem List: Decreased strength;Decreased activity tolerance;Decreased balance;Decreased mobility;Decreased knowledge of use of DME;Decreased knowledge of precautions;Pain PT Treatment Interventions: DME instruction;Gait training;Stair training;Functional mobility training;Therapeutic activities;Balance training;Therapeutic exercise;Patient/family education   PT Goals Acute Rehab PT Goals PT Goal Formulation: With patient/family Time For Goal Achievement: 09/23/12 Potential to Achieve Goals: Good Pt will go Supine/Side to Sit: with modified independence PT Goal: Supine/Side to Sit - Progress: Goal set today Pt will go Sit to Supine/Side: with modified independence PT Goal: Sit to Supine/Side - Progress: Goal set today Pt will go Sit to Stand: with modified independence PT Goal: Sit to Stand - Progress: Goal set today Pt will go Stand to Sit: with modified independence PT Goal: Stand to Sit - Progress: Goal set today Pt will Ambulate: >150 feet;with modified independence;with least restrictive assistive device PT Goal: Ambulate - Progress: Goal set today Pt will Go Up / Down Stairs: 1-2 stairs;with supervision;with least restrictive assistive device (In order to step up into truck on stool.) PT Goal: Up/Down Stairs - Progress: Goal set today Pt will Perform Home Exercise Program: Independently PT Goal: Perform Home Exercise Program - Progress: Goal set today  Visit Information  Last PT Received On: 09/16/12 Assistance Needed: +1    Subjective Data  Subjective: "I've already been up." Patient Stated Goal: Go home.   Prior Functioning  Home Living Lives With: Spouse Available Help at Discharge: Family;Available 24  hours/day Type of Home: Mobile home Home Access: Ramped entrance Home Layout: One level Bathroom Shower/Tub:  Tub/shower unit;Curtain Bathroom Toilet: Standard Home Adaptive Equipment: Walker - rolling;Bedside commode/3-in-1;Wheelchair - manual (Has something for the tub, but unsure what it looks like.) Prior Function Level of Independence: Independent Able to Take Stairs?: Yes Driving: No Vocation: Retired Musician: No difficulties    Cognition  Overall Cognitive Status: Appears within functional limits for tasks assessed/performed Arousal/Alertness: Awake/alert Orientation Level: Appears intact for tasks assessed Behavior During Session: [facility] for tasks performed    Extremity/Trunk Assessment Right Upper Extremity Assessment RUE ROM/Strength/Tone: Within functional levels RUE Sensation: WFL - Light Touch RUE Coordination: WFL - gross/fine motor Left Upper Extremity Assessment LUE ROM/Strength/Tone: Within functional levels LUE Sensation: WFL - Light Touch LUE Coordination: WFL - gross/fine motor Right Lower Extremity Assessment RLE ROM/Strength/Tone: Within functional levels RLE Sensation: WFL - Light Touch RLE Coordination: WFL - gross/fine motor Left Lower Extremity Assessment LLE ROM/Strength/Tone: Deficits;Due to pain LLE ROM/Strength/Tone Deficits: 3+/5 due to pain. LLE Sensation: WFL - Light Touch LLE Coordination: WFL - gross motor Trunk Assessment Trunk Assessment: Normal   Balance Balance Balance Assessed: No  End of Session PT - End of Session Equipment Utilized During Treatment: Gait belt Activity Tolerance: Patient tolerated treatment well Patient left: in chair;with call bell/phone within reach;with family/visitor present Nurse Communication: Mobility status  GP     Cephus Shelling 09/16/2012, 9:03 AM  09/16/2012 Cephus Shelling, PT, DPT [phone]

## 2012-09-17 ENCOUNTER — Encounter (HOSPITAL_COMMUNITY): Payer: Self-pay | Admitting: Orthopedic Surgery

## 2012-09-17 LAB — CBC
HCT: 30.6 % — ABNORMAL LOW (ref 36.0–46.0)
Hemoglobin: 10.3 g/dL — ABNORMAL LOW (ref 12.0–15.0)
MCH: 31.4 pg (ref 26.0–34.0)
MCHC: 33.7 g/dL (ref 30.0–36.0)
MCV: 93.3 fL (ref 78.0–100.0)
RBC: 3.28 MIL/uL — ABNORMAL LOW (ref 3.87–5.11)

## 2012-09-17 LAB — GLUCOSE, CAPILLARY

## 2012-09-17 MED ORDER — HYDROCODONE-ACETAMINOPHEN 5-325 MG PO TABS: 1.0000 | ORAL_TABLET | ORAL | Status: DC | PRN

## 2012-09-17 MED ORDER — ASPIRIN 325 MG PO TBEC: 325.0000 mg | DELAYED_RELEASE_TABLET | Freq: Two times a day (BID) | ORAL | Status: AC

## 2012-09-17 MED ORDER — METHOCARBAMOL 500 MG PO TABS: 500.0000 mg | ORAL_TABLET | Freq: Four times a day (QID) | ORAL | Status: AC | PRN

## 2012-09-17 NOTE — Progress Notes (Signed)
Physical Therapy Treatment Patient Details Name: Michele Stewart MRN: [id number] DOB: 04-19-1941 Today's Date: 09/17/2012 Time: 4098-1191 PT Time Calculation (min): 26 min  PT Assessment / Plan / Recommendation Comments on Treatment Session  Pt admitted s/p left THA and has progressed great.  Pt able to increase ambulation distance/independence today.  Pt ready for safe d/c home once medically cleared by MD.    Follow Up Recommendations  Home health PT    Barriers to Discharge        Equipment Recommendations  None recommended by PT    Recommendations for Other Services    Frequency 7X/week   Plan Discharge plan remains appropriate;Frequency remains appropriate    Precautions / Restrictions Precautions Precautions: Posterior Hip Precaution Booklet Issued: No Precaution Comments: Pt able to recall 3/3 posterior hip precautions. Restrictions Weight Bearing Restrictions: Yes LLE Weight Bearing: Weight bearing as tolerated   Pertinent Vitals/Pain 2/10 in left hip.  Pt repositioned.    Mobility  Bed Mobility Bed Mobility: Supine to Sit Supine to Sit: 6: Modified independent (Device/Increase time) Transfers Transfers: Sit to Stand;Stand to Sit (2 trials.) Sit to Stand: 5: Supervision;With upper extremity assist;From bed;From chair/3-in-1 Stand to Sit: 5: Supervision;With upper extremity assist;To chair/3-in-1 Details for Transfer Assistance: Verbal cues for safest hand placement. Ambulation/Gait Ambulation/Gait Assistance: 5: Supervision Ambulation Distance (Feet): 200 Feet Assistive device: Rolling walker Ambulation/Gait Assistance Details: Verbal cues for safest sequence progressing to step-through. Gait Pattern: Step-to pattern;Step-through pattern;Decreased step length - left;Decreased stance time - left Stairs: Yes Stairs Assistance: 4: Min guard Stair Management Technique: Step to pattern;Backwards;With walker Number of Stairs: 1  (2 trials.) Wheelchair  Mobility Wheelchair Mobility: No    Exercises     PT Diagnosis:    PT Problem List:   PT Treatment Interventions:     PT Goals Acute Rehab PT Goals PT Goal Formulation: With patient/family Time For Goal Achievement: 09/23/12 Potential to Achieve Goals: Good PT Goal: Supine/Side to Sit - Progress: Met PT Goal: Sit to Stand - Progress: Progressing toward goal PT Goal: Stand to Sit - Progress: Progressing toward goal PT Goal: Ambulate - Progress: Progressing toward goal PT Goal: Up/Down Stairs - Progress: Progressing toward goal  Visit Information  Last PT Received On: 09/17/12 Assistance Needed: +1    Subjective Data  Subjective: "I am ready!" Patient Stated Goal: Go home.   Cognition  Overall Cognitive Status: Appears within functional limits for tasks assessed/performed Arousal/Alertness: Awake/alert Orientation Level: Appears intact for tasks assessed Behavior During Session: [facility] for tasks performed    Balance  Balance Balance Assessed: No  End of Session PT - End of Session Equipment Utilized During Treatment: Gait belt Activity Tolerance: Patient tolerated treatment well Patient left: in chair;with call bell/phone within reach;with family/visitor present Nurse Communication: Mobility status   GP     Cephus Shelling 09/17/2012, 10:08 AM  09/17/2012 Cephus Shelling, PT, DPT [phone]

## 2012-09-17 NOTE — Progress Notes (Signed)
I agree with the following treatment note after reviewing documentation.   [name], Holle Sprick Brynn   OTR/L Pager: [phone] Office: [phone] .   

## 2012-09-17 NOTE — Progress Notes (Signed)
PATIENT ID: Michele Stewart  MRN: [id number]  DOB/AGE:  1941-05-24 / 71 y.o.  2 Days Post-Op Procedure(s) (LRB): TOTAL HIP ARTHROPLASTY (Left)    PROGRESS NOTE Subjective: Patient is alert, oriented,no Nausea, no Vomiting, yes passing gas, no Bowel Movement. Taking PO well. Denies SOB, Chest or Calf Pain. Using Incentive Spirometer, PAS in place. Ambulating well with PT. Patient reports pain as mild  .    Objective: Vital signs in last 24 hours: Filed Vitals:   09/16/12 1600 09/16/12 2147 09/17/12 0330 09/17/12 0623  BP:  98/58 100/60 129/53  Pulse:  68 72 84  Temp:    98.5 F (36.9 C)  TempSrc:      Resp: 16   16  Height:    5\' 1"  (1.549 m)  Weight:    88.642 kg (195 lb 6.7 oz)  SpO2:    99%      Intake/Output from previous day: I/O last 3 completed shifts: In: 4500 [P.O.:2280; I.V.:2220] Out: 3400 [Urine:3400]   Intake/Output this shift:     LABORATORY DATA:  Basename 09/17/12 0745 09/17/12 0705 09/16/12 2222 09/16/12 1734 09/16/12 0540  WBC -- 11.6* -- -- 8.9  HGB -- 10.3* -- -- 9.1*  HCT -- 30.6* -- -- 26.8*  PLT -- 261 -- -- 229  NA -- -- -- -- 137  K -- -- -- -- 3.1*  CL -- -- -- -- 98  CO2 -- -- -- -- 29  BUN -- -- -- -- 21  CREATININE -- -- -- -- 1.19*  GLUCOSE -- -- -- -- 174*  GLUCAP 168* -- 112* 171* --  INR -- -- -- -- --  CALCIUM -- -- -- -- 8.6    Examination: Neurologically intact ABD soft Neurovascular intact Sensation intact distally Intact pulses distally Dorsiflexion/Plantar flexion intact Incision: scant drainage} XR AP&Lat of hip shows well placed\fixed THA  Assessment:   2 Days Post-Op Procedure(s) (LRB): TOTAL HIP ARTHROPLASTY (Left) ADDITIONAL DIAGNOSIS:  UTI during Hospitalization and none  Plan: PT/OT WBAT, THA  posterior precautions  DVT Prophylaxis: SCDx72 hrs, ASA 325 mg BID x 2 weeks  DISCHARGE PLAN: Home today if PT goals met.  DISCHARGE NEEDS: HHPT, HHRN, Walker and 3-in-1 comode seat

## 2012-09-17 NOTE — Discharge Summary (Signed)
Patient ID: Michele Stewart MRN: [id number] DOB/AGE: Mar 29, 1941 71 y.o.  Admit date: 09/15/2012 Discharge date: 09/17/2012  Admission Diagnoses:  Principal Problem:  *Osteoarthritis of left hip Active Problems:  Obesity (BMI 30-39.9)   Discharge Diagnoses:  Same  Past Medical History  Diagnosis Date  . Peripheral edema   . Hyperlipidemia   . Hypertension     Benign Essential Hypertension  . Cold intolerance   . GERD (gastroesophageal reflux disease)   . Constipation   . Peripheral vascular disease     "have to wear compression stockings all the time" (September 22, 2012)  . Chronic bronchitis     "have it a couple times q winter" (Sep 22, 2012)  . Pneumonia 1971  . Type II diabetes mellitus   . Arthritis     "all over" (09-22-2012)    Surgeries: Procedure(s): TOTAL HIP ARTHROPLASTY on 09/15/2012   Consultants:    Discharged Condition: Improved  Hospital Course: Michele Stewart is an 71 y.o. female who was admitted 09/15/2012 for operative treatment ofOsteoarthritis of left hip. Patient has severe unremitting pain that affects sleep, daily activities, and work/hobbies. After pre-op clearance the patient was taken to the operating room on 09/15/2012 and underwent  Procedure(s): TOTAL HIP ARTHROPLASTY.    Patient was given perioperative antibiotics: Anti-infectives     Start     Dose/Rate Route Frequency Ordered Stop   09/14/12 1403   vancomycin (VANCOCIN) IVPB 1000 mg/200 mL premix        1,000 mg 200 mL/hr over 60 Minutes Intravenous 60 min pre-op 09/14/12 1403 09/15/12 0915           Patient was given sequential compression devices, early ambulation, and chemoprophylaxis to prevent DVT.  Patient benefited maximally from hospital stay and there were no complications.    Recent vital signs: Patient Vitals for the past 24 hrs:  BP Temp Temp src Pulse Resp SpO2 Height Weight  09/17/12 0623 129/53 mmHg 98.5 F (36.9 C) - 84  16  99 % 5\' 1"  (1.549 m) 88.642 kg (195 lb 6.7  oz)  09/17/12 0330 100/60 mmHg - - 72  - - - -  22-Sep-2012 2147 98/58 mmHg - - 68  - - - -  09-22-12 1600 - - - - 16  - - -  09-22-2012 1400 101/46 mmHg 98.5 F (36.9 C) - 81  16  93 % - -  09-22-2012 1340 110/40 mmHg 98.4 F (36.9 C) Oral 82  16  92 % - -  Sep 22, 2012 1200 - - - - 16  - - -  09-22-12 0830 100/42 mmHg 98 F (36.7 C) Oral 78  18  94 % - -     Recent laboratory studies:  Basename 09/17/12 0705 09-22-2012 0540  WBC 11.6* 8.9  HGB 10.3* 9.1*  HCT 30.6* 26.8*  PLT 261 229  NA -- 137  K -- 3.1*  CL -- 98  CO2 -- 29  BUN -- 21  CREATININE -- 1.19*  GLUCOSE -- 174*  INR -- --  CALCIUM -- 8.6     Discharge Medications:     Medication List     As of 09/17/2012  8:02 AM    TAKE these medications         amLODipine 10 MG tablet   Commonly known as: NORVASC   Take 10 mg by mouth daily.      aspirin 325 MG EC tablet   Take 1 tablet (325 mg total) by mouth 2 (two) times  daily.      atorvastatin 40 MG tablet   Commonly known as: LIPITOR   Take 40 mg by mouth daily.      bisoprolol-hydrochlorothiazide 10-6.25 MG per tablet   Commonly known as: ZIAC   Take 1 tablet by mouth daily.      CITRACAL + D PO   Take 2 tablets by mouth daily.      estradiol 0.0375 MG/24HR   Commonly known as: VIVELLE-DOT   Place 1 patch onto the skin 2 (two) times a week.      famotidine 20 MG tablet   Commonly known as: PEPCID   Take 20 mg by mouth daily.      furosemide 80 MG tablet   Commonly known as: LASIX   Take 80 mg by mouth daily.      gemfibrozil 600 MG tablet   Commonly known as: LOPID   Take 600 mg by mouth daily.      GLUCOSAMINE PO   Take 2,000 mg by mouth daily.      HYDROcodone-acetaminophen 5-325 MG per tablet   Commonly known as: NORCO/VICODIN   Take 1 tablet by mouth every 4 (four) hours as needed for pain.      indomethacin 50 MG capsule   Commonly known as: INDOCIN   Take 50 mg by mouth daily.      linagliptin 5 MG Tabs tablet   Commonly known as:  TRADJENTA   Take 5 mg by mouth daily.      lisinopril 20 MG tablet   Commonly known as: PRINIVIL,ZESTRIL   Take 20 mg by mouth daily.      loratadine 10 MG tablet   Commonly known as: CLARITIN   Take 10 mg by mouth daily.      methocarbamol 500 MG tablet   Commonly known as: ROBAXIN   Take 1 tablet (500 mg total) by mouth every 6 (six) hours as needed.      potassium chloride 10 MEQ CR tablet   Commonly known as: KLOR-CON   Take 10 mEq by mouth daily.      psyllium 0.52 G capsule   Commonly known as: REGULOID   Take 0.52 g by mouth 3 (three) times daily.      valsartan 320 MG tablet   Commonly known as: DIOVAN   Take 320 mg by mouth daily.      Vitamin D-3 5000 UNITS Tabs   Take 1 tablet by mouth daily.      vitamin E 1000 UNIT capsule   Take 1,000 Units by mouth daily.        Diagnostic Studies: Dg Chest 2 View  09/09/2012  *RADIOLOGY REPORT*  Clinical Data: Preop for left hip arthroplasty  CHEST - 2 VIEW  Comparison: CT scan of the chest 06/10/2011  Findings: Cardiomediastinal silhouette is stable.  No acute infiltrate or pleural effusion.  No pulmonary edema.  Mild degenerative changes thoracic spine.  IMPRESSION: No active disease.   Original Report Authenticated By: Natasha Mead, M.D.    Dg Pelvis Portable  09/15/2012  *RADIOLOGY REPORT*  Clinical Data: Left hip pain.  PORTABLE PELVIS  Comparison: None.  Findings: The femoral and acetabular components appear well seated. No complicating features are demonstrated.  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   Original Report Authenticated By: P. Loralie Champagne, M.D.    Dg Hip Portable 1 View Left  09/15/2012  *RADIOLOGY REPORT*  Clinical Data: Left hip replacement surgery.  PORTABLE LEFT  HIP - 1 VIEW  Comparison: None.  Findings: The hip prosthesis appears well seated without complicating features.  IMPRESSION: As above.   Original Report Authenticated By: P. Loralie Champagne, M.D.      Disposition: Final discharge disposition not confirmed      Discharge Orders    Future Orders Please Complete By Expires   Increase activity slowly      Walker       May shower / Bathe      Change dressing (specify)      Comments:   Dressing change as needed.   Call MD for:  temperature >100.4      Call MD for:  severe uncontrolled pain      Call MD for:  redness, tenderness, or signs of infection (pain, swelling, redness, odor or green/yellow discharge around incision site)      Discharge instructions      Comments:   F/U with Dr. Turner Daniels as scheduled.   Driving Restrictions      Comments:   No driving for 2 weeks.         SignedHazle Nordmann. 09/17/2012, 8:02 AM

## 2012-09-17 NOTE — Progress Notes (Signed)
Occupational Therapy Evaluation Patient Details Name: Michele Stewart MRN: [id number] DOB: [birth year] Today's Date: 09/17/2012 Time: 4098-1191 OT Time Calculation (min): 33 min  OT Assessment / Plan / Recommendation Clinical Impression  Pt. is 71 yo female s/p left posterior total hip arthroplasty. Pt. is WBAT  and demonstrates understanding of use for AE. If pt. not d/c tomorrow OT will practice tub transfer, otherwise no further acute OT needed at this time    OT Assessment  Patient needs continued OT Services (1 more visit to pratice tub transfer)    Follow Up Recommendations  Home health OT    Barriers to Discharge      Equipment Recommendations  None recommended by OT    Recommendations for Other Services    Frequency  Min 1X/week    Precautions / Restrictions Precautions Precautions: Posterior Hip Precaution Booklet Issued: Yes (comment) Precaution Comments: Pt able to recall 3/3 posterior hip precautions. Restrictions Weight Bearing Restrictions: Yes LLE Weight Bearing: Weight bearing as tolerated   Pertinent Vitals/Pain None taken     ADL  Eating/Feeding: Performed;Set up Where Assessed - Eating/Feeding: Chair Grooming: Performed;Wash/dry hands;Modified independent Where Assessed - Grooming: Supported standing Lower Body Bathing: Simulated;Modified independent Where Assessed - Lower Body Bathing: Supported sitting Lower Body Dressing: Performed;Modified independent Where Assessed - Lower Body Dressing: Supported sitting Toilet Transfer: Performed;Modified independent Toilet Transfer Method: Sit to stand Toilet Transfer Equipment: Raised toilet seat with arms (or 3-in-1 over toilet) Toileting - Clothing Manipulation and Hygiene: Performed;Modified independent (uses RW for support) Where Assessed - Toileting Clothing Manipulation and Hygiene: Standing Equipment Used: Rolling walker;Gait belt Transfers/Ambulation Related to ADLs: Pt. did very well with  transfers. Pt demonstrated safe hand placement during sit<>stand and stand<>sit. Pt. was supervision while walking in room for safety  ADL Comments: Pt. educated on use of AE (reacher, long handled sponge, sock aid)  Pt. demonstrated understanding for use of each and inquired on where to buy a hip kit. Pt. educated on posterior hip precautions and was able to verbalize understanding and demonstrate understanding    OT Diagnosis: Acute pain  OT Problem List: Decreased range of motion OT Treatment Interventions: Self-care/ADL training   OT Goals Acute Rehab OT Goals OT Goal Formulation: With patient Time For Goal Achievement: 09/23/12 Potential to Achieve Goals: Good ADL Goals Pt Will Perform Tub/Shower Transfer: Tub transfer;with min assist;Maintaining hip precautions ADL Goal: Tub/Shower Transfer - Progress: Goal set today  Visit Information  Last OT Received On: 09/17/12 Assistance Needed: +1    Subjective Data  Subjective: [name] will be able to help out a lot when I get home.  Patient Stated Goal: To go back home    Prior Functioning  Vision/Perception  Home Living Lives With: Spouse Available Help at Discharge: Family;Available 24 hours/day Type of Home: Mobile home Home Access: Ramped entrance Home Layout: One level Bathroom Shower/Tub: Tub/shower unit;Curtain Firefighter: Standard Bathroom Accessibility: Yes How Accessible: Accessible via walker Home Adaptive Equipment: Walker - rolling;Bedside commode/3-in-1;Wheelchair - manual Additional Comments: Husband made a step for the tub but states he has been helping her get into the tub for about a month now  Prior Function Level of Independence: Independent Able to Take Stairs?: Yes Driving: No Vocation: Retired Musician: No difficulties Dominant Hand: Right      Cognition  Overall Cognitive Status: Appears within functional limits for tasks assessed/performed Arousal/Alertness:  Awake/alert Orientation Level: Appears intact for tasks assessed Behavior During Session: [facility] for tasks performed  Extremity/Trunk Assessment Right Upper Extremity Assessment RUE ROM/Strength/Tone: WFL for tasks assessed Left Upper Extremity Assessment LUE ROM/Strength/Tone: WFL for tasks assessed Trunk Assessment Trunk Assessment: Normal   Mobility  Shoulder Instructions  Bed Mobility Bed Mobility: Not assessed Supine to Sit: 6: Modified independent (Device/Increase time) Details for Bed Mobility Assistance: Pt. was up in chair upon arrival  Transfers Transfers: Sit to Stand;Stand to Sit Sit to Stand: 5: Supervision;With upper extremity assist;From chair/3-in-1;From toilet;With armrests Stand to Sit: 5: Supervision;With upper extremity assist;To chair/3-in-1;To toilet;With armrests Details for Transfer Assistance: Pt. demonstrated safe hand placement and maintained hip precautions while using RW        Exercise     Balance Balance Balance Assessed: Yes High Level Balance High Level Balance Activites: Backward walking High Level Balance Comments: Pt. able to take 3-4 small steps backwards to get onto chair and ionto toilet   End of Session OT - End of Session Equipment Utilized During Treatment: Gait belt Activity Tolerance: Patient tolerated treatment well Patient left: in chair;with call bell/phone within reach;with family/visitor present Nurse Communication: Mobility status;Precautions  GO     Michele Stewart 09/17/2012, 11:19 AM

## 2012-09-17 NOTE — Progress Notes (Addendum)
Physical Therapy Progress Note   09/17/12 1100  PT Visit Information  Last PT Received On 09/17/12  Assistance Needed +1  PT Time Calculation  PT Start Time 1120  PT Stop Time 1150  PT Time Calculation (min) 30 min  Subjective Data  Subjective I am a bit cold.  Patient Stated Goal To go home  Precautions  Precautions Posterior Hip  Restrictions  Weight Bearing Restrictions Yes  LLE Weight Bearing WBAT  Cognition  Overall Cognitive Status Appears within functional limits for tasks assessed/performed  Arousal/Alertness Awake/alert  Orientation Level Appears intact for tasks assessed  Behavior During Session [facility] for tasks performed  Bed Mobility  Bed Mobility Not assessed  Transfers  Transfers Sit to Stand;Stand to Sit  Sit to Stand 5: Supervision;With upper extremity assist;From chair/3-in-1  Stand to Sit 5: Supervision;With upper extremity assist;To chair/3-in-1  Details for Transfer Assistance Cues for posture and safety  Ambulation/Gait  Ambulation/Gait Assistance Not tested (comment)  Exercises  Exercises Total Joint  Total Joint Exercises  Ankle Circles/Pumps AROM;Both;10 reps  Quad Sets AROM;Left;10 reps  Heel Slides AROM;Left;10 reps  Short Arc Quad AROM;Left;10 reps  Hip ABduction/ADduction AROM;Left;10 reps  Long Arc Quad AROM;Left;10 reps  Knee Flexion AROM;Left;10 reps  Marching in Standing AROM;Left;10 reps  Standing Hip Extension AROM;Left;10 reps  PT - End of Session  Equipment Utilized During Treatment Gait belt  Activity Tolerance Patient tolerated treatment well  Patient left in chair;with call bell/phone within reach;with family/visitor present  Nurse Communication Mobility status  PT - Assessment/Plan  Comments on Treatment Session Pt progressed HEP to included seated and standing exercises and did great.  Pt ready for safe d/c home once medically cleared by MD.  PT Plan Discharge plan remains appropriate;Frequency remains appropriate  PT Frequency  7X/week  Follow Up Recommendations Home health PT  Equipment Recommended None recommended by PT  Acute Rehab PT Goals  PT Goal Formulation With patient/family  Time For Goal Achievement 09/23/12  Potential to Achieve Goals Good  Pt will go Sit to Stand with modified independence  PT Goal: Sit to Stand - Progress Progressing toward goal  Pt will go Stand to Sit with modified independence  PT Goal: Stand to Sit - Progress Progressing toward goal  Pt will Perform Home Exercise Program Independently  PT Goal: Perform Home Exercise Program - Progress Progressing toward goal    Pt report having minimal pain in L LE.  Amy DiTommaso, SPT ------------------------------------------------------------------------------------------------------------------------------------------------------- Agree with treatment and progress.  09/17/2012 Cephus Shelling, PT, DPT [phone]

## 2012-09-18 DIAGNOSIS — M6281 Muscle weakness (generalized): Secondary | ICD-10-CM | POA: Diagnosis not present

## 2012-09-18 DIAGNOSIS — R269 Unspecified abnormalities of gait and mobility: Secondary | ICD-10-CM | POA: Diagnosis not present

## 2012-09-18 DIAGNOSIS — Z96649 Presence of unspecified artificial hip joint: Secondary | ICD-10-CM | POA: Diagnosis not present

## 2012-09-18 DIAGNOSIS — I10 Essential (primary) hypertension: Secondary | ICD-10-CM | POA: Diagnosis not present

## 2012-09-18 DIAGNOSIS — Z471 Aftercare following joint replacement surgery: Secondary | ICD-10-CM | POA: Diagnosis not present

## 2012-09-22 DIAGNOSIS — R269 Unspecified abnormalities of gait and mobility: Secondary | ICD-10-CM | POA: Diagnosis not present

## 2012-09-22 DIAGNOSIS — Z96649 Presence of unspecified artificial hip joint: Secondary | ICD-10-CM | POA: Diagnosis not present

## 2012-09-22 DIAGNOSIS — Z471 Aftercare following joint replacement surgery: Secondary | ICD-10-CM | POA: Diagnosis not present

## 2012-09-22 DIAGNOSIS — M6281 Muscle weakness (generalized): Secondary | ICD-10-CM | POA: Diagnosis not present

## 2012-09-22 DIAGNOSIS — I10 Essential (primary) hypertension: Secondary | ICD-10-CM | POA: Diagnosis not present

## 2012-09-24 DIAGNOSIS — R269 Unspecified abnormalities of gait and mobility: Secondary | ICD-10-CM | POA: Diagnosis not present

## 2012-09-24 DIAGNOSIS — Z471 Aftercare following joint replacement surgery: Secondary | ICD-10-CM | POA: Diagnosis not present

## 2012-09-24 DIAGNOSIS — Z96649 Presence of unspecified artificial hip joint: Secondary | ICD-10-CM | POA: Diagnosis not present

## 2012-09-24 DIAGNOSIS — M6281 Muscle weakness (generalized): Secondary | ICD-10-CM | POA: Diagnosis not present

## 2012-09-24 DIAGNOSIS — I10 Essential (primary) hypertension: Secondary | ICD-10-CM | POA: Diagnosis not present

## 2012-09-26 DIAGNOSIS — Z96649 Presence of unspecified artificial hip joint: Secondary | ICD-10-CM | POA: Diagnosis not present

## 2012-09-26 DIAGNOSIS — R269 Unspecified abnormalities of gait and mobility: Secondary | ICD-10-CM | POA: Diagnosis not present

## 2012-09-26 DIAGNOSIS — I10 Essential (primary) hypertension: Secondary | ICD-10-CM | POA: Diagnosis not present

## 2012-09-26 DIAGNOSIS — Z471 Aftercare following joint replacement surgery: Secondary | ICD-10-CM | POA: Diagnosis not present

## 2012-09-26 DIAGNOSIS — M6281 Muscle weakness (generalized): Secondary | ICD-10-CM | POA: Diagnosis not present

## 2012-09-29 DIAGNOSIS — Z96649 Presence of unspecified artificial hip joint: Secondary | ICD-10-CM | POA: Diagnosis not present

## 2012-09-29 DIAGNOSIS — I10 Essential (primary) hypertension: Secondary | ICD-10-CM | POA: Diagnosis not present

## 2012-09-29 DIAGNOSIS — R269 Unspecified abnormalities of gait and mobility: Secondary | ICD-10-CM | POA: Diagnosis not present

## 2012-09-29 DIAGNOSIS — M6281 Muscle weakness (generalized): Secondary | ICD-10-CM | POA: Diagnosis not present

## 2012-09-29 DIAGNOSIS — Z471 Aftercare following joint replacement surgery: Secondary | ICD-10-CM | POA: Diagnosis not present

## 2012-09-30 DIAGNOSIS — M25559 Pain in unspecified hip: Secondary | ICD-10-CM | POA: Diagnosis not present

## 2012-10-01 DIAGNOSIS — M6281 Muscle weakness (generalized): Secondary | ICD-10-CM | POA: Diagnosis not present

## 2012-10-01 DIAGNOSIS — Z96649 Presence of unspecified artificial hip joint: Secondary | ICD-10-CM | POA: Diagnosis not present

## 2012-10-01 DIAGNOSIS — I10 Essential (primary) hypertension: Secondary | ICD-10-CM | POA: Diagnosis not present

## 2012-10-01 DIAGNOSIS — R197 Diarrhea, unspecified: Secondary | ICD-10-CM | POA: Diagnosis not present

## 2012-10-01 DIAGNOSIS — R269 Unspecified abnormalities of gait and mobility: Secondary | ICD-10-CM | POA: Diagnosis not present

## 2012-10-01 DIAGNOSIS — Z471 Aftercare following joint replacement surgery: Secondary | ICD-10-CM | POA: Diagnosis not present

## 2012-10-03 DIAGNOSIS — M6281 Muscle weakness (generalized): Secondary | ICD-10-CM | POA: Diagnosis not present

## 2012-10-03 DIAGNOSIS — Z471 Aftercare following joint replacement surgery: Secondary | ICD-10-CM | POA: Diagnosis not present

## 2012-10-03 DIAGNOSIS — I10 Essential (primary) hypertension: Secondary | ICD-10-CM | POA: Diagnosis not present

## 2012-10-03 DIAGNOSIS — Z96649 Presence of unspecified artificial hip joint: Secondary | ICD-10-CM | POA: Diagnosis not present

## 2012-10-03 DIAGNOSIS — R269 Unspecified abnormalities of gait and mobility: Secondary | ICD-10-CM | POA: Diagnosis not present

## 2012-10-07 DIAGNOSIS — Z96649 Presence of unspecified artificial hip joint: Secondary | ICD-10-CM | POA: Diagnosis not present

## 2012-10-07 DIAGNOSIS — Z471 Aftercare following joint replacement surgery: Secondary | ICD-10-CM | POA: Diagnosis not present

## 2012-10-07 DIAGNOSIS — M6281 Muscle weakness (generalized): Secondary | ICD-10-CM | POA: Diagnosis not present

## 2012-10-07 DIAGNOSIS — I10 Essential (primary) hypertension: Secondary | ICD-10-CM | POA: Diagnosis not present

## 2012-10-07 DIAGNOSIS — R269 Unspecified abnormalities of gait and mobility: Secondary | ICD-10-CM | POA: Diagnosis not present

## 2012-10-08 DIAGNOSIS — R197 Diarrhea, unspecified: Secondary | ICD-10-CM | POA: Diagnosis not present

## 2012-10-08 DIAGNOSIS — E119 Type 2 diabetes mellitus without complications: Secondary | ICD-10-CM | POA: Diagnosis not present

## 2012-10-08 DIAGNOSIS — I10 Essential (primary) hypertension: Secondary | ICD-10-CM | POA: Diagnosis not present

## 2012-10-08 DIAGNOSIS — Z6836 Body mass index (BMI) 36.0-36.9, adult: Secondary | ICD-10-CM | POA: Diagnosis not present

## 2012-10-10 DIAGNOSIS — M6281 Muscle weakness (generalized): Secondary | ICD-10-CM | POA: Diagnosis not present

## 2012-10-10 DIAGNOSIS — I10 Essential (primary) hypertension: Secondary | ICD-10-CM | POA: Diagnosis not present

## 2012-10-10 DIAGNOSIS — Z471 Aftercare following joint replacement surgery: Secondary | ICD-10-CM | POA: Diagnosis not present

## 2012-10-10 DIAGNOSIS — Z96649 Presence of unspecified artificial hip joint: Secondary | ICD-10-CM | POA: Diagnosis not present

## 2012-10-10 DIAGNOSIS — R269 Unspecified abnormalities of gait and mobility: Secondary | ICD-10-CM | POA: Diagnosis not present

## 2012-10-14 DIAGNOSIS — I10 Essential (primary) hypertension: Secondary | ICD-10-CM | POA: Diagnosis not present

## 2012-10-14 DIAGNOSIS — Z471 Aftercare following joint replacement surgery: Secondary | ICD-10-CM | POA: Diagnosis not present

## 2012-10-14 DIAGNOSIS — Z96649 Presence of unspecified artificial hip joint: Secondary | ICD-10-CM | POA: Diagnosis not present

## 2012-10-14 DIAGNOSIS — M6281 Muscle weakness (generalized): Secondary | ICD-10-CM | POA: Diagnosis not present

## 2012-10-14 DIAGNOSIS — R269 Unspecified abnormalities of gait and mobility: Secondary | ICD-10-CM | POA: Diagnosis not present

## 2012-10-16 DIAGNOSIS — Z471 Aftercare following joint replacement surgery: Secondary | ICD-10-CM | POA: Diagnosis not present

## 2012-10-16 DIAGNOSIS — I10 Essential (primary) hypertension: Secondary | ICD-10-CM | POA: Diagnosis not present

## 2012-10-16 DIAGNOSIS — Z96649 Presence of unspecified artificial hip joint: Secondary | ICD-10-CM | POA: Diagnosis not present

## 2012-10-16 DIAGNOSIS — M6281 Muscle weakness (generalized): Secondary | ICD-10-CM | POA: Diagnosis not present

## 2012-10-16 DIAGNOSIS — R269 Unspecified abnormalities of gait and mobility: Secondary | ICD-10-CM | POA: Diagnosis not present

## 2012-10-22 DIAGNOSIS — I10 Essential (primary) hypertension: Secondary | ICD-10-CM | POA: Diagnosis not present

## 2012-10-22 DIAGNOSIS — M6281 Muscle weakness (generalized): Secondary | ICD-10-CM | POA: Diagnosis not present

## 2012-10-22 DIAGNOSIS — R269 Unspecified abnormalities of gait and mobility: Secondary | ICD-10-CM | POA: Diagnosis not present

## 2012-10-22 DIAGNOSIS — Z471 Aftercare following joint replacement surgery: Secondary | ICD-10-CM | POA: Diagnosis not present

## 2012-10-22 DIAGNOSIS — Z96649 Presence of unspecified artificial hip joint: Secondary | ICD-10-CM | POA: Diagnosis not present

## 2012-11-03 DIAGNOSIS — Z23 Encounter for immunization: Secondary | ICD-10-CM | POA: Diagnosis not present

## 2012-11-03 DIAGNOSIS — E119 Type 2 diabetes mellitus without complications: Secondary | ICD-10-CM | POA: Diagnosis not present

## 2012-11-03 DIAGNOSIS — D509 Iron deficiency anemia, unspecified: Secondary | ICD-10-CM | POA: Diagnosis not present

## 2012-11-03 DIAGNOSIS — D62 Acute posthemorrhagic anemia: Secondary | ICD-10-CM | POA: Diagnosis not present

## 2012-11-03 DIAGNOSIS — E785 Hyperlipidemia, unspecified: Secondary | ICD-10-CM | POA: Diagnosis not present

## 2012-11-03 DIAGNOSIS — I10 Essential (primary) hypertension: Secondary | ICD-10-CM | POA: Diagnosis not present

## 2012-11-25 DIAGNOSIS — D509 Iron deficiency anemia, unspecified: Secondary | ICD-10-CM | POA: Diagnosis not present

## 2012-11-25 DIAGNOSIS — K591 Functional diarrhea: Secondary | ICD-10-CM | POA: Diagnosis not present

## 2013-01-14 DIAGNOSIS — E119 Type 2 diabetes mellitus without complications: Secondary | ICD-10-CM | POA: Diagnosis not present

## 2013-01-14 DIAGNOSIS — K591 Functional diarrhea: Secondary | ICD-10-CM | POA: Diagnosis not present

## 2013-01-14 DIAGNOSIS — K648 Other hemorrhoids: Secondary | ICD-10-CM | POA: Diagnosis not present

## 2013-01-14 DIAGNOSIS — R197 Diarrhea, unspecified: Secondary | ICD-10-CM | POA: Diagnosis not present

## 2013-01-14 DIAGNOSIS — K5730 Diverticulosis of large intestine without perforation or abscess without bleeding: Secondary | ICD-10-CM | POA: Diagnosis not present

## 2013-01-14 DIAGNOSIS — D509 Iron deficiency anemia, unspecified: Secondary | ICD-10-CM | POA: Diagnosis not present

## 2013-01-14 DIAGNOSIS — E7800 Pure hypercholesterolemia, unspecified: Secondary | ICD-10-CM | POA: Diagnosis not present

## 2013-01-14 DIAGNOSIS — K219 Gastro-esophageal reflux disease without esophagitis: Secondary | ICD-10-CM | POA: Diagnosis not present

## 2013-01-14 DIAGNOSIS — Z79899 Other long term (current) drug therapy: Secondary | ICD-10-CM | POA: Diagnosis not present

## 2013-01-14 DIAGNOSIS — I10 Essential (primary) hypertension: Secondary | ICD-10-CM | POA: Diagnosis not present

## 2013-01-20 IMAGING — CR DG PORTABLE PELVIS
1 series · 1 of 1 positions shown · non-contrast
Comparison: None.

CLINICAL DATA: Left hip pain.

PORTABLE PELVIS

[AP]
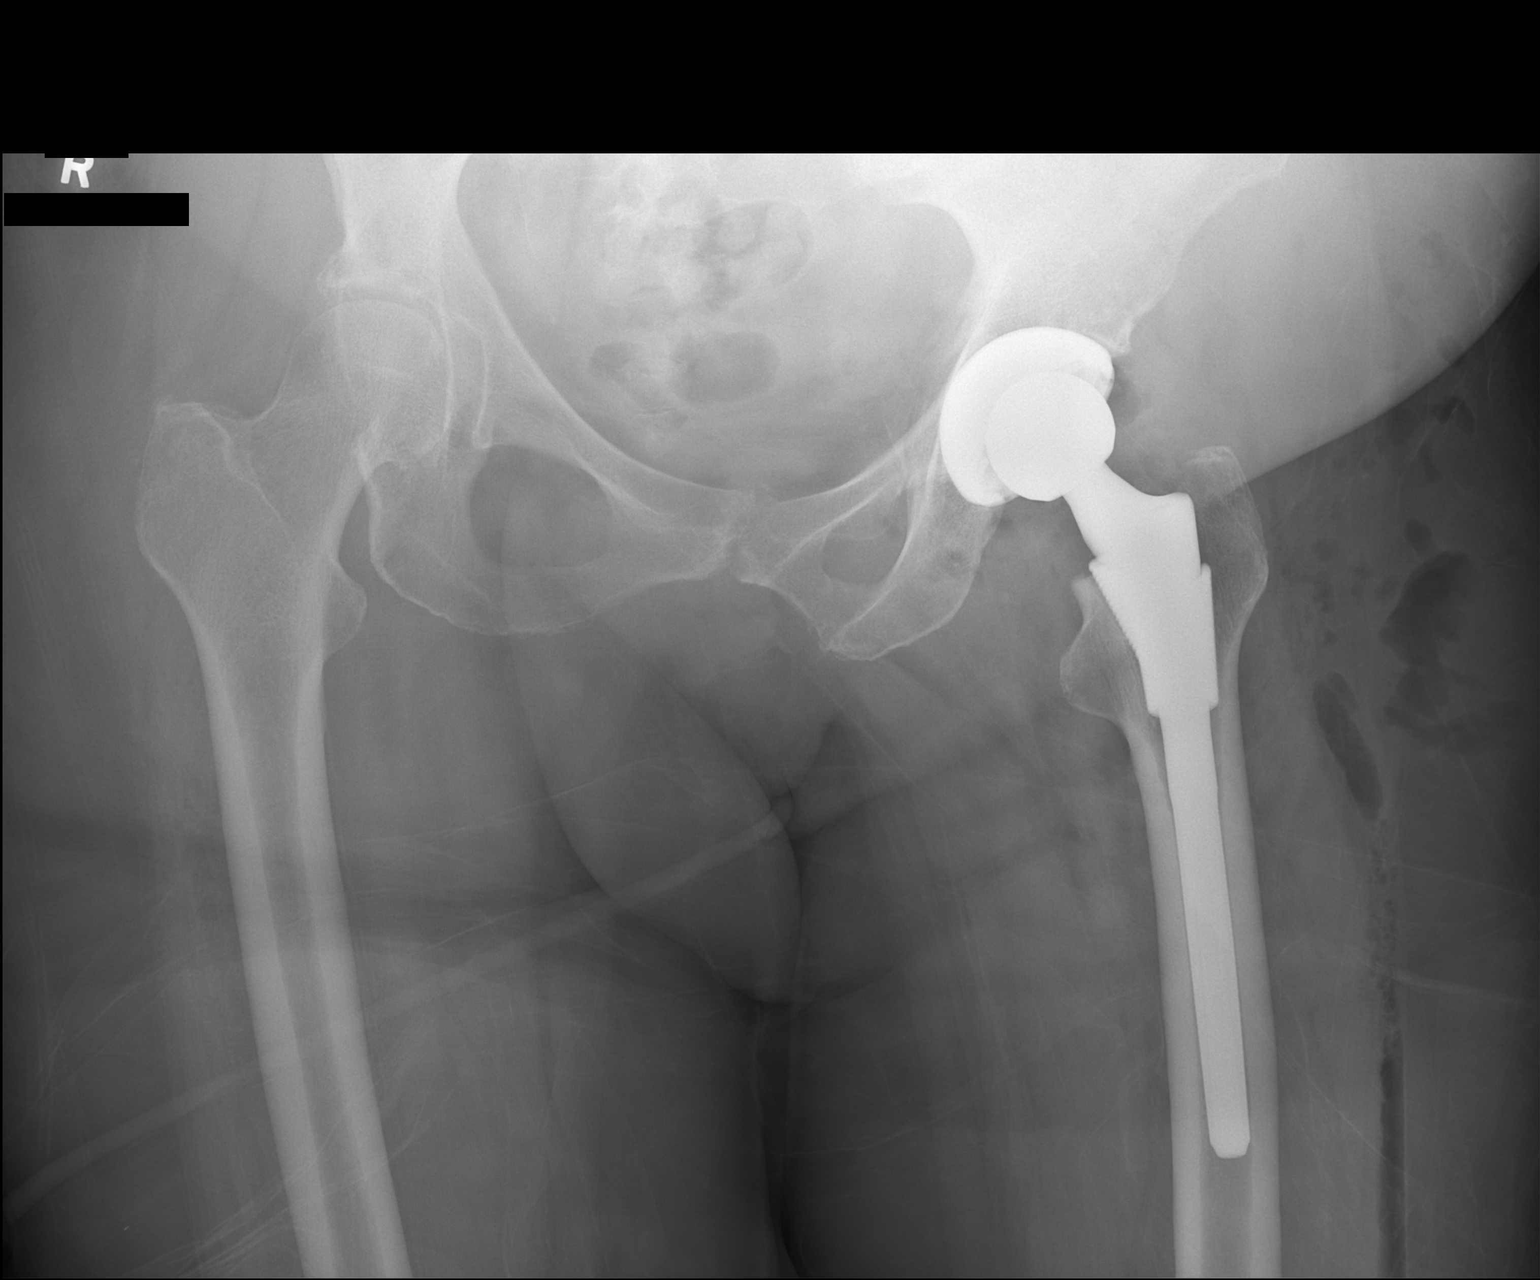

[1 of 1 positions shown; findings below may reference images not displayed]

FINDINGS: The femoral and acetabular components appear well seated.
No complicating features are demonstrated.
IMPRESSION: Well seated left hip prosthesis without complicating features.

## 2013-01-28 ENCOUNTER — Other Ambulatory Visit: Payer: Self-pay | Admitting: Gynecology

## 2013-01-28 DIAGNOSIS — Z1231 Encounter for screening mammogram for malignant neoplasm of breast: Secondary | ICD-10-CM

## 2013-02-09 DIAGNOSIS — Z9181 History of falling: Secondary | ICD-10-CM | POA: Diagnosis not present

## 2013-02-09 DIAGNOSIS — E119 Type 2 diabetes mellitus without complications: Secondary | ICD-10-CM | POA: Diagnosis not present

## 2013-02-09 DIAGNOSIS — Z6836 Body mass index (BMI) 36.0-36.9, adult: Secondary | ICD-10-CM | POA: Diagnosis not present

## 2013-02-09 DIAGNOSIS — I10 Essential (primary) hypertension: Secondary | ICD-10-CM | POA: Diagnosis not present

## 2013-02-09 DIAGNOSIS — Z1331 Encounter for screening for depression: Secondary | ICD-10-CM | POA: Diagnosis not present

## 2013-02-09 DIAGNOSIS — E785 Hyperlipidemia, unspecified: Secondary | ICD-10-CM | POA: Diagnosis not present

## 2013-02-09 DIAGNOSIS — D509 Iron deficiency anemia, unspecified: Secondary | ICD-10-CM | POA: Diagnosis not present

## 2013-02-17 DIAGNOSIS — M169 Osteoarthritis of hip, unspecified: Secondary | ICD-10-CM | POA: Diagnosis not present

## 2013-02-25 ENCOUNTER — Ambulatory Visit
Admission: RE | Admit: 2013-02-25 | Discharge: 2013-02-25 | Disposition: A | Discharge status: Home / Self Care | Payer: Medicare Other | Source: Ambulatory Visit | Attending: Gynecology | Admitting: Gynecology

## 2013-02-25 DIAGNOSIS — Z1231 Encounter for screening mammogram for malignant neoplasm of breast: Secondary | ICD-10-CM | POA: Diagnosis not present

## 2013-02-25 DIAGNOSIS — E2839 Other primary ovarian failure: Secondary | ICD-10-CM | POA: Diagnosis not present

## 2013-05-11 DIAGNOSIS — D509 Iron deficiency anemia, unspecified: Secondary | ICD-10-CM | POA: Diagnosis not present

## 2013-05-11 DIAGNOSIS — Z6836 Body mass index (BMI) 36.0-36.9, adult: Secondary | ICD-10-CM | POA: Diagnosis not present

## 2013-05-11 DIAGNOSIS — E119 Type 2 diabetes mellitus without complications: Secondary | ICD-10-CM | POA: Diagnosis not present

## 2013-05-11 DIAGNOSIS — I10 Essential (primary) hypertension: Secondary | ICD-10-CM | POA: Diagnosis not present

## 2013-05-11 DIAGNOSIS — E785 Hyperlipidemia, unspecified: Secondary | ICD-10-CM | POA: Diagnosis not present

## 2013-06-01 DIAGNOSIS — Z6836 Body mass index (BMI) 36.0-36.9, adult: Secondary | ICD-10-CM | POA: Diagnosis not present

## 2013-06-01 DIAGNOSIS — J019 Acute sinusitis, unspecified: Secondary | ICD-10-CM | POA: Diagnosis not present

## 2013-08-06 DIAGNOSIS — M25559 Pain in unspecified hip: Secondary | ICD-10-CM | POA: Diagnosis not present

## 2013-08-17 DIAGNOSIS — E785 Hyperlipidemia, unspecified: Secondary | ICD-10-CM | POA: Diagnosis not present

## 2013-08-17 DIAGNOSIS — J309 Allergic rhinitis, unspecified: Secondary | ICD-10-CM | POA: Diagnosis not present

## 2013-08-17 DIAGNOSIS — E119 Type 2 diabetes mellitus without complications: Secondary | ICD-10-CM | POA: Diagnosis not present

## 2013-08-17 DIAGNOSIS — D509 Iron deficiency anemia, unspecified: Secondary | ICD-10-CM | POA: Diagnosis not present

## 2013-08-17 DIAGNOSIS — I10 Essential (primary) hypertension: Secondary | ICD-10-CM | POA: Diagnosis not present

## 2013-08-17 DIAGNOSIS — Z6835 Body mass index (BMI) 35.0-35.9, adult: Secondary | ICD-10-CM | POA: Diagnosis not present

## 2013-11-23 DIAGNOSIS — Z6836 Body mass index (BMI) 36.0-36.9, adult: Secondary | ICD-10-CM | POA: Diagnosis not present

## 2013-11-23 DIAGNOSIS — I10 Essential (primary) hypertension: Secondary | ICD-10-CM | POA: Diagnosis not present

## 2013-11-23 DIAGNOSIS — E119 Type 2 diabetes mellitus without complications: Secondary | ICD-10-CM | POA: Diagnosis not present

## 2013-11-23 DIAGNOSIS — E785 Hyperlipidemia, unspecified: Secondary | ICD-10-CM | POA: Diagnosis not present

## 2013-11-23 DIAGNOSIS — Z23 Encounter for immunization: Secondary | ICD-10-CM | POA: Diagnosis not present

## 2014-01-22 DIAGNOSIS — Z6836 Body mass index (BMI) 36.0-36.9, adult: Secondary | ICD-10-CM | POA: Diagnosis not present

## 2014-01-22 DIAGNOSIS — J209 Acute bronchitis, unspecified: Secondary | ICD-10-CM | POA: Diagnosis not present

## 2014-03-01 DIAGNOSIS — I10 Essential (primary) hypertension: Secondary | ICD-10-CM | POA: Diagnosis not present

## 2014-03-01 DIAGNOSIS — E785 Hyperlipidemia, unspecified: Secondary | ICD-10-CM | POA: Diagnosis not present

## 2014-03-01 DIAGNOSIS — D509 Iron deficiency anemia, unspecified: Secondary | ICD-10-CM | POA: Diagnosis not present

## 2014-03-01 DIAGNOSIS — IMO0001 Reserved for inherently not codable concepts without codable children: Secondary | ICD-10-CM | POA: Diagnosis not present

## 2014-03-01 DIAGNOSIS — Z6835 Body mass index (BMI) 35.0-35.9, adult: Secondary | ICD-10-CM | POA: Diagnosis not present

## 2014-03-29 DIAGNOSIS — K625 Hemorrhage of anus and rectum: Secondary | ICD-10-CM | POA: Diagnosis not present

## 2014-03-29 DIAGNOSIS — Z6837 Body mass index (BMI) 37.0-37.9, adult: Secondary | ICD-10-CM | POA: Diagnosis not present

## 2014-03-29 DIAGNOSIS — K648 Other hemorrhoids: Secondary | ICD-10-CM | POA: Diagnosis not present

## 2014-03-29 DIAGNOSIS — K921 Melena: Secondary | ICD-10-CM | POA: Diagnosis not present

## 2014-04-06 DIAGNOSIS — K921 Melena: Secondary | ICD-10-CM | POA: Diagnosis not present

## 2014-04-08 DIAGNOSIS — I10 Essential (primary) hypertension: Secondary | ICD-10-CM | POA: Diagnosis not present

## 2014-04-08 DIAGNOSIS — Z79899 Other long term (current) drug therapy: Secondary | ICD-10-CM | POA: Diagnosis not present

## 2014-04-08 DIAGNOSIS — K263 Acute duodenal ulcer without hemorrhage or perforation: Secondary | ICD-10-CM | POA: Diagnosis not present

## 2014-04-08 DIAGNOSIS — K219 Gastro-esophageal reflux disease without esophagitis: Secondary | ICD-10-CM | POA: Diagnosis not present

## 2014-04-08 DIAGNOSIS — K5900 Constipation, unspecified: Secondary | ICD-10-CM | POA: Diagnosis not present

## 2014-04-08 DIAGNOSIS — K591 Functional diarrhea: Secondary | ICD-10-CM | POA: Diagnosis not present

## 2014-04-08 DIAGNOSIS — K2990 Gastroduodenitis, unspecified, without bleeding: Secondary | ICD-10-CM | POA: Diagnosis not present

## 2014-04-08 DIAGNOSIS — E876 Hypokalemia: Secondary | ICD-10-CM | POA: Diagnosis not present

## 2014-04-08 DIAGNOSIS — K269 Duodenal ulcer, unspecified as acute or chronic, without hemorrhage or perforation: Secondary | ICD-10-CM | POA: Diagnosis not present

## 2014-04-08 DIAGNOSIS — D509 Iron deficiency anemia, unspecified: Secondary | ICD-10-CM | POA: Diagnosis not present

## 2014-04-08 DIAGNOSIS — A048 Other specified bacterial intestinal infections: Secondary | ICD-10-CM | POA: Diagnosis not present

## 2014-04-08 DIAGNOSIS — K589 Irritable bowel syndrome without diarrhea: Secondary | ICD-10-CM | POA: Diagnosis not present

## 2014-04-08 DIAGNOSIS — E7800 Pure hypercholesterolemia, unspecified: Secondary | ICD-10-CM | POA: Diagnosis not present

## 2014-04-08 DIAGNOSIS — E119 Type 2 diabetes mellitus without complications: Secondary | ICD-10-CM | POA: Diagnosis not present

## 2014-04-08 DIAGNOSIS — K921 Melena: Secondary | ICD-10-CM | POA: Diagnosis not present

## 2014-04-08 DIAGNOSIS — K2970 Gastritis, unspecified, without bleeding: Secondary | ICD-10-CM | POA: Diagnosis not present

## 2014-04-13 DIAGNOSIS — Z6837 Body mass index (BMI) 37.0-37.9, adult: Secondary | ICD-10-CM | POA: Diagnosis not present

## 2014-04-13 DIAGNOSIS — D62 Acute posthemorrhagic anemia: Secondary | ICD-10-CM | POA: Diagnosis not present

## 2014-04-13 DIAGNOSIS — K263 Acute duodenal ulcer without hemorrhage or perforation: Secondary | ICD-10-CM | POA: Diagnosis not present

## 2014-04-13 DIAGNOSIS — N183 Chronic kidney disease, stage 3 unspecified: Secondary | ICD-10-CM | POA: Diagnosis not present

## 2014-04-22 DIAGNOSIS — D649 Anemia, unspecified: Secondary | ICD-10-CM | POA: Diagnosis not present

## 2014-05-06 ENCOUNTER — Other Ambulatory Visit: Payer: Self-pay

## 2014-05-06 DIAGNOSIS — Z1231 Encounter for screening mammogram for malignant neoplasm of breast: Secondary | ICD-10-CM

## 2014-05-13 DIAGNOSIS — D531 Other megaloblastic anemias, not elsewhere classified: Secondary | ICD-10-CM | POA: Diagnosis not present

## 2014-05-13 DIAGNOSIS — D649 Anemia, unspecified: Secondary | ICD-10-CM | POA: Diagnosis not present

## 2014-05-18 DIAGNOSIS — D649 Anemia, unspecified: Secondary | ICD-10-CM | POA: Diagnosis not present

## 2014-05-25 DIAGNOSIS — N289 Disorder of kidney and ureter, unspecified: Secondary | ICD-10-CM | POA: Diagnosis not present

## 2014-05-25 DIAGNOSIS — D649 Anemia, unspecified: Secondary | ICD-10-CM | POA: Diagnosis not present

## 2014-06-01 ENCOUNTER — Ambulatory Visit
Admission: RE | Admit: 2014-06-01 | Discharge: 2014-06-01 | Disposition: A | Discharge status: Home / Self Care | Payer: Medicare Other | Source: Ambulatory Visit

## 2014-06-01 ENCOUNTER — Encounter (INDEPENDENT_AMBULATORY_CARE_PROVIDER_SITE_OTHER): Payer: Self-pay

## 2014-06-01 DIAGNOSIS — Z1231 Encounter for screening mammogram for malignant neoplasm of breast: Secondary | ICD-10-CM

## 2014-06-07 DIAGNOSIS — N039 Chronic nephritic syndrome with unspecified morphologic changes: Secondary | ICD-10-CM | POA: Diagnosis not present

## 2014-06-07 DIAGNOSIS — I10 Essential (primary) hypertension: Secondary | ICD-10-CM | POA: Diagnosis not present

## 2014-06-07 DIAGNOSIS — D631 Anemia in chronic kidney disease: Secondary | ICD-10-CM | POA: Diagnosis not present

## 2014-06-07 DIAGNOSIS — Z23 Encounter for immunization: Secondary | ICD-10-CM | POA: Diagnosis not present

## 2014-06-07 DIAGNOSIS — E785 Hyperlipidemia, unspecified: Secondary | ICD-10-CM | POA: Diagnosis not present

## 2014-06-07 DIAGNOSIS — N183 Chronic kidney disease, stage 3 unspecified: Secondary | ICD-10-CM | POA: Diagnosis not present

## 2014-06-07 DIAGNOSIS — IMO0001 Reserved for inherently not codable concepts without codable children: Secondary | ICD-10-CM | POA: Diagnosis not present

## 2014-08-25 DIAGNOSIS — D649 Anemia, unspecified: Secondary | ICD-10-CM | POA: Diagnosis not present

## 2014-08-26 DIAGNOSIS — N189 Chronic kidney disease, unspecified: Secondary | ICD-10-CM | POA: Diagnosis not present

## 2014-08-26 DIAGNOSIS — N039 Chronic nephritic syndrome with unspecified morphologic changes: Secondary | ICD-10-CM | POA: Diagnosis not present

## 2014-08-26 DIAGNOSIS — D631 Anemia in chronic kidney disease: Secondary | ICD-10-CM | POA: Diagnosis not present

## 2014-09-07 DIAGNOSIS — E119 Type 2 diabetes mellitus without complications: Secondary | ICD-10-CM | POA: Diagnosis not present

## 2014-09-09 DIAGNOSIS — M1710 Unilateral primary osteoarthritis, unspecified knee: Secondary | ICD-10-CM | POA: Diagnosis not present

## 2014-09-09 DIAGNOSIS — M25559 Pain in unspecified hip: Secondary | ICD-10-CM | POA: Diagnosis not present

## 2014-09-13 DIAGNOSIS — I10 Essential (primary) hypertension: Secondary | ICD-10-CM | POA: Diagnosis not present

## 2014-09-13 DIAGNOSIS — Z6838 Body mass index (BMI) 38.0-38.9, adult: Secondary | ICD-10-CM | POA: Diagnosis not present

## 2014-09-13 DIAGNOSIS — N183 Chronic kidney disease, stage 3 unspecified: Secondary | ICD-10-CM | POA: Diagnosis not present

## 2014-09-13 DIAGNOSIS — E785 Hyperlipidemia, unspecified: Secondary | ICD-10-CM | POA: Diagnosis not present

## 2014-09-13 DIAGNOSIS — IMO0001 Reserved for inherently not codable concepts without codable children: Secondary | ICD-10-CM | POA: Diagnosis not present

## 2014-11-29 DIAGNOSIS — D631 Anemia in chronic kidney disease: Secondary | ICD-10-CM | POA: Diagnosis not present

## 2014-11-29 DIAGNOSIS — N189 Chronic kidney disease, unspecified: Secondary | ICD-10-CM | POA: Diagnosis not present

## 2014-12-20 DIAGNOSIS — Z1389 Encounter for screening for other disorder: Secondary | ICD-10-CM | POA: Diagnosis not present

## 2014-12-20 DIAGNOSIS — N183 Chronic kidney disease, stage 3 (moderate): Secondary | ICD-10-CM | POA: Diagnosis not present

## 2014-12-20 DIAGNOSIS — I10 Essential (primary) hypertension: Secondary | ICD-10-CM | POA: Diagnosis not present

## 2014-12-20 DIAGNOSIS — E785 Hyperlipidemia, unspecified: Secondary | ICD-10-CM | POA: Diagnosis not present

## 2014-12-20 DIAGNOSIS — Z23 Encounter for immunization: Secondary | ICD-10-CM | POA: Diagnosis not present

## 2014-12-20 DIAGNOSIS — Z9181 History of falling: Secondary | ICD-10-CM | POA: Diagnosis not present

## 2014-12-20 DIAGNOSIS — Z13820 Encounter for screening for osteoporosis: Secondary | ICD-10-CM | POA: Diagnosis not present

## 2014-12-20 DIAGNOSIS — E1129 Type 2 diabetes mellitus with other diabetic kidney complication: Secondary | ICD-10-CM | POA: Diagnosis not present

## 2015-01-03 DIAGNOSIS — Z13820 Encounter for screening for osteoporosis: Secondary | ICD-10-CM | POA: Diagnosis not present

## 2015-01-03 DIAGNOSIS — M85851 Other specified disorders of bone density and structure, right thigh: Secondary | ICD-10-CM | POA: Diagnosis not present

## 2015-01-03 DIAGNOSIS — M8580 Other specified disorders of bone density and structure, unspecified site: Secondary | ICD-10-CM | POA: Diagnosis not present

## 2015-01-19 DIAGNOSIS — J019 Acute sinusitis, unspecified: Secondary | ICD-10-CM | POA: Diagnosis not present

## 2015-01-19 DIAGNOSIS — Z6841 Body Mass Index (BMI) 40.0 and over, adult: Secondary | ICD-10-CM | POA: Diagnosis not present

## 2015-02-28 DIAGNOSIS — D631 Anemia in chronic kidney disease: Secondary | ICD-10-CM | POA: Diagnosis not present

## 2015-02-28 DIAGNOSIS — N189 Chronic kidney disease, unspecified: Secondary | ICD-10-CM | POA: Diagnosis not present

## 2015-03-21 DIAGNOSIS — E785 Hyperlipidemia, unspecified: Secondary | ICD-10-CM | POA: Diagnosis not present

## 2015-03-21 DIAGNOSIS — Z6841 Body Mass Index (BMI) 40.0 and over, adult: Secondary | ICD-10-CM | POA: Diagnosis not present

## 2015-03-21 DIAGNOSIS — E1129 Type 2 diabetes mellitus with other diabetic kidney complication: Secondary | ICD-10-CM | POA: Diagnosis not present

## 2015-03-21 DIAGNOSIS — K580 Irritable bowel syndrome with diarrhea: Secondary | ICD-10-CM | POA: Diagnosis not present

## 2015-03-21 DIAGNOSIS — I10 Essential (primary) hypertension: Secondary | ICD-10-CM | POA: Diagnosis not present

## 2015-03-21 DIAGNOSIS — N183 Chronic kidney disease, stage 3 (moderate): Secondary | ICD-10-CM | POA: Diagnosis not present

## 2015-04-11 DIAGNOSIS — D631 Anemia in chronic kidney disease: Secondary | ICD-10-CM | POA: Diagnosis not present

## 2015-04-11 DIAGNOSIS — N189 Chronic kidney disease, unspecified: Secondary | ICD-10-CM | POA: Diagnosis not present

## 2015-05-23 DIAGNOSIS — N189 Chronic kidney disease, unspecified: Secondary | ICD-10-CM | POA: Diagnosis not present

## 2015-05-23 DIAGNOSIS — D631 Anemia in chronic kidney disease: Secondary | ICD-10-CM | POA: Diagnosis not present

## 2015-06-27 DIAGNOSIS — N183 Chronic kidney disease, stage 3 (moderate): Secondary | ICD-10-CM | POA: Diagnosis not present

## 2015-06-27 DIAGNOSIS — Z760 Encounter for issue of repeat prescription: Secondary | ICD-10-CM | POA: Diagnosis not present

## 2015-06-27 DIAGNOSIS — Z6841 Body Mass Index (BMI) 40.0 and over, adult: Secondary | ICD-10-CM | POA: Diagnosis not present

## 2015-06-27 DIAGNOSIS — E785 Hyperlipidemia, unspecified: Secondary | ICD-10-CM | POA: Diagnosis not present

## 2015-06-27 DIAGNOSIS — E1129 Type 2 diabetes mellitus with other diabetic kidney complication: Secondary | ICD-10-CM | POA: Diagnosis not present

## 2015-06-27 DIAGNOSIS — Z1231 Encounter for screening mammogram for malignant neoplasm of breast: Secondary | ICD-10-CM | POA: Diagnosis not present

## 2015-06-27 DIAGNOSIS — I10 Essential (primary) hypertension: Secondary | ICD-10-CM | POA: Diagnosis not present

## 2015-07-14 ENCOUNTER — Other Ambulatory Visit: Payer: Self-pay

## 2015-07-14 DIAGNOSIS — Z1231 Encounter for screening mammogram for malignant neoplasm of breast: Secondary | ICD-10-CM

## 2015-07-20 ENCOUNTER — Ambulatory Visit
Admission: RE | Admit: 2015-07-20 | Discharge: 2015-07-20 | Disposition: A | Discharge status: Home / Self Care | Payer: Medicare Other | Source: Ambulatory Visit

## 2015-07-20 DIAGNOSIS — Z1231 Encounter for screening mammogram for malignant neoplasm of breast: Secondary | ICD-10-CM

## 2015-07-22 DIAGNOSIS — D631 Anemia in chronic kidney disease: Secondary | ICD-10-CM | POA: Diagnosis not present

## 2015-07-22 DIAGNOSIS — N189 Chronic kidney disease, unspecified: Secondary | ICD-10-CM | POA: Diagnosis not present

## 2015-09-23 DIAGNOSIS — N189 Chronic kidney disease, unspecified: Secondary | ICD-10-CM | POA: Diagnosis not present

## 2015-09-23 DIAGNOSIS — D631 Anemia in chronic kidney disease: Secondary | ICD-10-CM | POA: Diagnosis not present

## 2015-09-27 DIAGNOSIS — N183 Chronic kidney disease, stage 3 (moderate): Secondary | ICD-10-CM | POA: Diagnosis not present

## 2015-09-27 DIAGNOSIS — E785 Hyperlipidemia, unspecified: Secondary | ICD-10-CM | POA: Diagnosis not present

## 2015-09-27 DIAGNOSIS — Z9181 History of falling: Secondary | ICD-10-CM | POA: Diagnosis not present

## 2015-09-27 DIAGNOSIS — Z1389 Encounter for screening for other disorder: Secondary | ICD-10-CM | POA: Diagnosis not present

## 2015-09-27 DIAGNOSIS — I10 Essential (primary) hypertension: Secondary | ICD-10-CM | POA: Diagnosis not present

## 2015-09-27 DIAGNOSIS — E1129 Type 2 diabetes mellitus with other diabetic kidney complication: Secondary | ICD-10-CM | POA: Diagnosis not present

## 2015-12-21 DIAGNOSIS — Z6841 Body Mass Index (BMI) 40.0 and over, adult: Secondary | ICD-10-CM | POA: Diagnosis not present

## 2015-12-21 DIAGNOSIS — J019 Acute sinusitis, unspecified: Secondary | ICD-10-CM | POA: Diagnosis not present

## 2016-01-16 DIAGNOSIS — Z23 Encounter for immunization: Secondary | ICD-10-CM | POA: Diagnosis not present

## 2016-01-16 DIAGNOSIS — E1129 Type 2 diabetes mellitus with other diabetic kidney complication: Secondary | ICD-10-CM | POA: Diagnosis not present

## 2016-01-16 DIAGNOSIS — Z6841 Body Mass Index (BMI) 40.0 and over, adult: Secondary | ICD-10-CM | POA: Diagnosis not present

## 2016-01-16 DIAGNOSIS — I10 Essential (primary) hypertension: Secondary | ICD-10-CM | POA: Diagnosis not present

## 2016-01-16 DIAGNOSIS — N183 Chronic kidney disease, stage 3 (moderate): Secondary | ICD-10-CM | POA: Diagnosis not present

## 2016-01-16 DIAGNOSIS — E785 Hyperlipidemia, unspecified: Secondary | ICD-10-CM | POA: Diagnosis not present

## 2016-02-13 ENCOUNTER — Ambulatory Visit (INDEPENDENT_AMBULATORY_CARE_PROVIDER_SITE_OTHER): Payer: Medicare Other | Admitting: Podiatry

## 2016-02-13 ENCOUNTER — Encounter: Payer: Self-pay | Admitting: Podiatry

## 2016-02-13 ENCOUNTER — Ambulatory Visit: Payer: Medicare Other

## 2016-02-13 DIAGNOSIS — M79671 Pain in right foot: Secondary | ICD-10-CM | POA: Diagnosis not present

## 2016-02-13 DIAGNOSIS — S93601A Unspecified sprain of right foot, initial encounter: Secondary | ICD-10-CM | POA: Diagnosis not present

## 2016-02-13 DIAGNOSIS — Z6841 Body Mass Index (BMI) 40.0 and over, adult: Secondary | ICD-10-CM | POA: Diagnosis not present

## 2016-02-13 DIAGNOSIS — R52 Pain, unspecified: Secondary | ICD-10-CM | POA: Diagnosis not present

## 2016-02-13 NOTE — Progress Notes (Signed)
   Subjective:    Patient ID: Michele Stewart, female    DOB: [birth year], 75 y.o.   MRN: [id number]  HPI i have some right foot pain and i am a diabetic  this patient presents the office with chief complaint of a right foot. She says that she has been in  pain since Friday. She states the pain has worsened and she has had difficulty placing weight through her right foot. She usually walks with a cane but now needs a walker. She says she had x-rays taken by Dr. Fabio Asa and  following his exam presented to the office and was immediately given an appointment today. She has no history of trauma or injury to the foot. She says she was very active yesterday and the pain is very severe today. She presents the office today for an evaluation and treatment of her foot    Review of Systems  All other systems reviewed and are negative.      Objective:   Physical Exam GENERAL APPEARANCE: Alert, conversant. Appropriately groomed. No acute distress.  VASCULAR: Pedal pulses palpable at  [facility] and PT bilateral.  Capillary refill time is immediate to all digits,  Normal temperature gradient.  Digital hair growth is present bilateral  NEUROLOGIC: sensation is normal to 5.07 monofilament at 5/5 sites bilateral.  Light touch is intact bilateral, Muscle strength normal.  MUSCULOSKELETAL: acceptable muscle strength, tone and stability bilateral.  Intrinsic muscluature intact bilateral.  Rectus appearance of foot and digits noted bilateral. Pain redness and swelling with increased temperature at base 3,4 metatarsal right foot.  Mild pain along the course of the peroneal tendon right foot.  Palpable pain at base 3,4 metabase right foot.  DERMATOLOGIC: skin color, texture, and turgor are within normal limits.  No preulcerative lesions or ulcers  are seen, no interdigital maceration noted.  No open lesions present.  Digital nails are asymptomatic. No drainage noted.         Assessment & Plan:  Foot Sprain right  foot.  ROV  Xray reviewed revealing circular lesion in third metatarsal and possible break in bone lateral base fourth metatarsal right foot.  No Mobic prescribed since she has history of bleeding.  Recommended cam walker.  Home treatment of ice was recommended.  RTC 2 weeks .  If pain persists call for an appointment if one week.  She may be experiencing arthritic flare-up.  Her pain is out of proportion to clinical findings.  Cannot r/o charcot foot at this time except her x-rays studies appear to be in good joint alignment.  RTC 2 weeks.     Helane Gunther DPM

## 2016-02-15 ENCOUNTER — Ambulatory Visit: Payer: Self-pay | Admitting: Sports Medicine

## 2016-02-27 ENCOUNTER — Ambulatory Visit (INDEPENDENT_AMBULATORY_CARE_PROVIDER_SITE_OTHER): Payer: Medicare Other | Admitting: Podiatry

## 2016-02-27 ENCOUNTER — Encounter: Payer: Self-pay | Admitting: Podiatry

## 2016-02-27 VITALS — BP 117/59 | HR 64 | Resp 14

## 2016-02-27 DIAGNOSIS — S93601A Unspecified sprain of right foot, initial encounter: Secondary | ICD-10-CM

## 2016-02-27 NOTE — Addendum Note (Signed)
Addended by: Harlon Flor, Kemberly Taves L on: 02/27/2016 05:13 PM   Modules accepted: Orders, Medications

## 2016-02-27 NOTE — Progress Notes (Signed)
Subjective:     Patient ID: Michele Stewart, female   DOB: 10-Mar-1941, 75 y.o.   MRN: [id number]  HPI this patient presents to the office 2 weeks after a diagnosis of a foot sprain, right foot. X-rays were taken which revealed no pathology. She was seen in my office and diagnosed with a foot sprain and or arthritic flareup right foot. She was treated with a Cam Walker and recommended to use ice on her foot. She returns to the office 2 weeks later and states that she is 50% improved but she is able to now ambulate in her house shoes experiencing no pain. She was not given Mobitz due to her stomach. She presents the office today for an evaluation and treatment of her foot   Review of Systems     Objective:   Physical Exam GENERAL APPEARANCE: Alert, conversant. Appropriately groomed. No acute distress.  VASCULAR: Pedal pulses palpable at  [facility] and PT bilateral.  Capillary refill time is immediate to all digits,  Normal temperature gradient.  Digital hair growth is present bilateral  NEUROLOGIC: sensation is normal to 5.07 monofilament at 5/5 sites bilateral.  Light touch is intact bilateral, Muscle strength normal.  MUSCULOSKELETAL: acceptable muscle strength, tone and stability bilateral.  Intrinsic muscluature intact bilateral.  Rectus appearance of foot and digits noted bilateral. No evidence of redness or swelling or increased temperature lateral midfoot right foot. Pain is now present at 3,4 midshaft and not base 3,4 right.  Asymptomatic HAV  B/L.  DERMATOLOGIC: skin color, texture, and turgor are within normal limits.  No preulcerative lesions or ulcers  are seen, no interdigital maceration noted.  No open lesions present.  Digital nails are asymptomatic. No drainage noted.      Assessment:     Foot Sprain right     Plan:     ROV  She has no clinical signs of inflammation with decreased pain.  Told her to wear her elastic sock to control her swelling.  Wear cam walker as needed.  RTC  prn   Helane Gunther DPM

## 2016-02-28 NOTE — Addendum Note (Signed)
Addended by: Harlon Flor, Shaunessy Dobratz L on: 02/28/2016 04:04 PM   Modules accepted: Medications

## 2016-03-01 ENCOUNTER — Encounter: Payer: Self-pay | Admitting: Podiatry

## 2016-04-04 ENCOUNTER — Encounter: Payer: Self-pay | Admitting: Sports Medicine

## 2016-04-04 ENCOUNTER — Ambulatory Visit (INDEPENDENT_AMBULATORY_CARE_PROVIDER_SITE_OTHER): Payer: Medicare Other

## 2016-04-04 ENCOUNTER — Ambulatory Visit (INDEPENDENT_AMBULATORY_CARE_PROVIDER_SITE_OTHER): Payer: Medicare Other | Admitting: Sports Medicine

## 2016-04-04 DIAGNOSIS — M1000 Idiopathic gout, unspecified site: Secondary | ICD-10-CM | POA: Diagnosis not present

## 2016-04-04 DIAGNOSIS — M79672 Pain in left foot: Secondary | ICD-10-CM | POA: Diagnosis not present

## 2016-04-04 MED ORDER — COLCHICINE 0.6 MG PO TABS: 0.6000 mg | ORAL_TABLET | Freq: Two times a day (BID) | ORAL | Status: AC

## 2016-04-04 NOTE — Progress Notes (Signed)
Patient ID: Michele Stewart, female   DOB: [birth year], 75 y.o.   MRN: [id number] Subjective: Michele Stewart is a 75 y.o. diabetic female patient who presents to office for evaluation of Left foot pain. Patient complains of progressive pain this week;  Admits to warmth, redness, and swelling to the area that is unrelieved. Patient has tried not tried anything. Admits to family history of gout. Patient denies any other pedal complaints.   Reports blood sugars at home range between 90 and 100.   Patient Active Problem List   Diagnosis Date Noted  . Osteoarthritis of left hip 09/15/2012  . Obesity (BMI 30-39.9) 09/15/2012    Current Outpatient Prescriptions on File Prior to Visit  Medication Sig Dispense Refill  . ACCU-CHEK AVIVA PLUS test strip Reported on 03/01/2016    . ACCU-CHEK SOFTCLIX LANCETS lancets     . alendronate (FOSAMAX) 70 MG tablet     . aspirin EC 325 MG EC tablet Take 1 tablet (325 mg total) by mouth 2 (two) times daily. (Patient not taking: Reported on 03/01/2016) 30 tablet 0  . atorvastatin (LIPITOR) 40 MG tablet Take 40 mg by mouth daily.      . bisoprolol-hydrochlorothiazide (ZIAC) 10-6.25 MG tablet Take 1 tablet by mouth daily.    . Calcium Citrate-Vitamin D (CITRACAL + D PO) Take 2 tablets by mouth daily.    . Cholecalciferol (VITAMIN D-3) 5000 UNITS TABS Take 1 tablet by mouth daily.    Marland Kitchen dicyclomine (BENTYL) 20 MG tablet Take 20 mg by mouth 3 (three) times daily before meals.    . famotidine (PEPCID) 20 MG tablet Take 20 mg by mouth daily. Reported on 03/01/2016    . furosemide (LASIX) 80 MG tablet Take 80 mg by mouth daily.    Marland Kitchen gemfibrozil (LOPID) 600 MG tablet     . lisinopril (PRINIVIL,ZESTRIL) 20 MG tablet Take 20 mg by mouth daily.    . methocarbamol (ROBAXIN) 500 MG tablet Take 1 tablet (500 mg total) by mouth every 6 (six) hours as needed. (Patient not taking: Reported on 03/01/2016) 60 tablet 0  . Omega-3 Fatty Acids (FISH OIL PO) Take by mouth 2 (two) times  daily. Reported on 03/01/2016    . pioglitazone (ACTOS) 30 MG tablet Take 30 mg by mouth daily.    . potassium chloride (KLOR-CON) 10 MEQ CR tablet Take 10 mEq by mouth daily.      . psyllium (REGULOID) 0.52 G capsule Take 0.52 g by mouth 3 (three) times daily. Reported on 03/01/2016    . valsartan (DIOVAN) 320 MG tablet Take 320 mg by mouth daily.      . vitamin E 1000 UNIT capsule Take 1,000 Units by mouth daily.     No current facility-administered medications on file prior to visit.    Allergies  Allergen Reactions  . Penicillins Rash  . Shellfish Allergy Itching and Rash  . Sulfa Antibiotics Hives and Rash    Objective:  General: Alert and oriented x3 in no acute distress  Dermatology: Focal Swelling, warmth, redness present on the left fourth toe, No open lesions bilateral lower extremities, no webspace macerations, no ecchymosis bilateral, all nails x 10 are well manicured.  Vascular: Dorsalis Pedis and Posterior Tibial pedal pulses 1/4, Capillary Fill Time 3 seconds,(+) pedal hair growth bilateral,Temperature gradient increased over the left fourth toe.  Neurology: Gross sensation intact via light touch bilateral, (- )Tinels sign left foot.   Musculoskeletal: There is tenderness with palpation at fourth toe,  left foot ,No pain with calf compression bilateral. All joint range of motion is within normal limits except at the left 4th toe where there is pain and limiation, Strength within normal limits in all groups bilateral.    Xrays  Left Foot    Impression: Normal osseous mineralization. There is significant bunion and hammertoe deformities thus is difficult to tell if there is any osseous injury at the left fourth toe. However, there appears to be some osseous irregularities, possibly suggestive of gout. 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 Soft tissue swelling is focal to the 4th toe. No emphysema or gas appreciated. No foreign body. No other acute findings.        Assessment and Plan: Problem List Items Addressed This Visit    None    Visit Diagnoses    Left foot pain    -  Primary    Relevant Medications    colchicine 0.6 MG tablet    Other Relevant Orders    DG Foot 2 Views Left    Rheumatoid factor    CBC with Differential    Basic Metabolic Panel    Acute idiopathic gout, unspecified site        Relevant Medications    colchicine 0.6 MG tablet    Other Relevant Orders    Uric Acid    Sedimentation Rate    Rheumatoid factor    CBC with Differential    Basic Metabolic Panel        -Complete examination performed -Xrays reviewed -Discussed treatement options for gouty arthritis and gout education provided. -Patient declined steroid injection -Rx Colchicine 0.6mg  -Ordered arthritic lab panel; will call patient with results if abnormal -Advised patient to call if symptoms are not improved within 1 week -Patient to return in 2 weeks for re-check/further discussion for long term management of gout or sooner if condition worsens.  Asencion Islamitorya Kemari Narez, DPM

## 2016-04-04 NOTE — Patient Instructions (Signed)

## 2016-04-05 ENCOUNTER — Telehealth: Payer: Self-pay | Admitting: Sports Medicine

## 2016-04-05 LAB — CBC WITH DIFFERENTIAL/PLATELET
BASOS ABS: 0 10*3/uL (ref 0.0–0.2)
BASOS: 1 %
EOS (ABSOLUTE): 0.1 10*3/uL (ref 0.0–0.4)
Eos: 2 %
Hematocrit: 36.1 % (ref 34.0–46.6)
Hemoglobin: 11.8 g/dL (ref 11.1–15.9)
Immature Grans (Abs): 0 10*3/uL (ref 0.0–0.1)
Immature Granulocytes: 1 %
LYMPHS ABS: 1.1 10*3/uL (ref 0.7–3.1)
Lymphs: 18 %
MCH: 31.1 pg (ref 26.6–33.0)
MCHC: 32.7 g/dL (ref 31.5–35.7)
MCV: 95 fL (ref 79–97)
MONOS ABS: 0.9 10*3/uL (ref 0.1–0.9)
Monocytes: 14 %
NEUTROS ABS: 4 10*3/uL (ref 1.4–7.0)
Neutrophils: 64 %
PLATELETS: 216 10*3/uL (ref 150–379)
RBC: 3.79 x10E6/uL (ref 3.77–5.28)
RDW: 13.3 % (ref 12.3–15.4)
WBC: 6.1 10*3/uL (ref 3.4–10.8)

## 2016-04-05 LAB — BASIC METABOLIC PANEL
BUN / CREAT RATIO: 18 (ref 12–28)
BUN: 25 mg/dL (ref 8–27)
CHLORIDE: 101 mmol/L (ref 96–106)
CO2: 25 mmol/L (ref 18–29)
Calcium: 8.7 mg/dL (ref 8.7–10.3)
Creatinine, Ser: 1.38 mg/dL — ABNORMAL HIGH (ref 0.57–1.00)
GFR calc non Af Amer: 38 mL/min/{1.73_m2} — ABNORMAL LOW (ref >59)
GFR, EST AFRICAN AMERICAN: 43 mL/min/{1.73_m2} — AB (ref >59)
GLUCOSE: 108 mg/dL — AB (ref 65–99)
POTASSIUM: 3.9 mmol/L (ref 3.5–5.2)
SODIUM: 145 mmol/L — AB (ref 134–144)

## 2016-04-05 LAB — URIC ACID: Uric Acid: 11 mg/dL — ABNORMAL HIGH (ref 2.5–7.1)

## 2016-04-05 LAB — RHEUMATOID FACTOR

## 2016-04-05 LAB — SEDIMENTATION RATE: Sed Rate: 7 mm/hr (ref 0–40)

## 2016-04-05 NOTE — Telephone Encounter (Signed)
Called Patient to review lab work; uric acid level is severely elevated at 11 advised patient to continue with colchicine and to monitor symptoms, ice, elevate as needed. Patient reports that she has a walking boot from previous that she used on her opposite foot and was wondering if she should use this. I advised patient that she may use walking boot on her left foot or any open toe shoe to prevent any irritation or rubbing until gout is resolved. Patient advised to continue colchicine as prescribed and to monitor symptoms. Patient will follow up in office within 2 weeks for reevaluation and discussion of long-term gout management  -Dr. Marylene LandStover

## 2016-04-06 ENCOUNTER — Telehealth: Payer: Self-pay | Admitting: *Deleted

## 2016-04-06 NOTE — Telephone Encounter (Signed)
Pt states she is taking Colchicine twice a day and it has given her severe diarrhea. Pt asked if it would be okay to take it once daily.  I told pt that would be fine and to treat the diarrhea as the symptom require. Pt states she often has diarrhea so will treat it like she usually does.

## 2016-04-16 DIAGNOSIS — M109 Gout, unspecified: Secondary | ICD-10-CM | POA: Diagnosis not present

## 2016-04-16 DIAGNOSIS — N183 Chronic kidney disease, stage 3 (moderate): Secondary | ICD-10-CM | POA: Diagnosis not present

## 2016-04-16 DIAGNOSIS — E785 Hyperlipidemia, unspecified: Secondary | ICD-10-CM | POA: Diagnosis not present

## 2016-04-16 DIAGNOSIS — E1129 Type 2 diabetes mellitus with other diabetic kidney complication: Secondary | ICD-10-CM | POA: Diagnosis not present

## 2016-04-16 DIAGNOSIS — Z23 Encounter for immunization: Secondary | ICD-10-CM | POA: Diagnosis not present

## 2016-04-16 DIAGNOSIS — Z6841 Body Mass Index (BMI) 40.0 and over, adult: Secondary | ICD-10-CM | POA: Diagnosis not present

## 2016-04-16 DIAGNOSIS — I10 Essential (primary) hypertension: Secondary | ICD-10-CM | POA: Diagnosis not present

## 2016-04-19 ENCOUNTER — Ambulatory Visit: Payer: Medicare Other | Admitting: Sports Medicine

## 2016-04-24 ENCOUNTER — Telehealth: Payer: Self-pay | Admitting: *Deleted

## 2016-04-24 NOTE — Telephone Encounter (Signed)
Informed pt Dr. Marylene LandStover would discuss lab at 04/25/2016 300pm appt.

## 2016-04-25 ENCOUNTER — Ambulatory Visit (INDEPENDENT_AMBULATORY_CARE_PROVIDER_SITE_OTHER): Payer: Medicare Other | Admitting: Sports Medicine

## 2016-04-25 ENCOUNTER — Encounter: Payer: Self-pay | Admitting: Sports Medicine

## 2016-04-25 DIAGNOSIS — M79672 Pain in left foot: Secondary | ICD-10-CM | POA: Diagnosis not present

## 2016-04-25 DIAGNOSIS — M10072 Idiopathic gout, left ankle and foot: Secondary | ICD-10-CM

## 2016-04-25 DIAGNOSIS — M109 Gout, unspecified: Secondary | ICD-10-CM

## 2016-04-25 MED ORDER — ALLOPURINOL 100 MG PO TABS: 100.0000 mg | ORAL_TABLET | Freq: Every day | ORAL | Status: AC

## 2016-04-25 NOTE — Progress Notes (Signed)
Patient ID: Michele Stewart, female   DOB: 1941-09-16, 75 y.o.   MRN: [id number]   Subjective: Michele Stewart is a 75 y.o. diabetic female patient who returns to office for evaluation of Left fourth toe gout. Reports that she had increased diarrhea with colchicine. States, however, she continued to take the medication Once a day instead of twice a day and used Imodium for diarrhea. Reports that her toe feels 100 times better. However, still slightly red, swollen and bigger than all her other toes. Patient denies any other pedal complaints.   Reports blood sugars at home range between 90 and 100.   Patient Active Problem List   Diagnosis Date Noted  . Osteoarthritis of left hip 09/15/2012  . Obesity (BMI 30-39.9) 09/15/2012    Current Outpatient Prescriptions on File Prior to Visit  Medication Sig Dispense Refill  . ACCU-CHEK AVIVA PLUS test strip Reported on 03/01/2016    . ACCU-CHEK SOFTCLIX LANCETS lancets     . alendronate (FOSAMAX) 70 MG tablet     . aspirin EC 325 MG EC tablet Take 1 tablet (325 mg total) by mouth 2 (two) times daily. (Patient not taking: Reported on 03/01/2016) 30 tablet 0  . atorvastatin (LIPITOR) 40 MG tablet Take 40 mg by mouth daily.      . bisoprolol-hydrochlorothiazide (ZIAC) 10-6.25 MG tablet Take 1 tablet by mouth daily.    . Calcium Citrate-Vitamin D (CITRACAL + D PO) Take 2 tablets by mouth daily.    . Cholecalciferol (VITAMIN D-3) 5000 UNITS TABS Take 1 tablet by mouth daily.    . colchicine 0.6 MG tablet Take 1 tablet (0.6 mg total) by mouth 2 (two) times daily. 30 tablet 0  . dicyclomine (BENTYL) 20 MG tablet Take 20 mg by mouth 3 (three) times daily before meals.    . famotidine (PEPCID) 20 MG tablet Take 20 mg by mouth daily. Reported on 03/01/2016    . furosemide (LASIX) 80 MG tablet Take 80 mg by mouth daily.    Marland Kitchen gemfibrozil (LOPID) 600 MG tablet     . lisinopril (PRINIVIL,ZESTRIL) 20 MG tablet Take 20 mg by mouth daily.    . methocarbamol (ROBAXIN)  500 MG tablet Take 1 tablet (500 mg total) by mouth every 6 (six) hours as needed. (Patient not taking: Reported on 03/01/2016) 60 tablet 0  . Omega-3 Fatty Acids (FISH OIL PO) Take by mouth 2 (two) times daily. Reported on 03/01/2016    . pioglitazone (ACTOS) 30 MG tablet Take 30 mg by mouth daily.    . potassium chloride (KLOR-CON) 10 MEQ CR tablet Take 10 mEq by mouth daily.      . psyllium (REGULOID) 0.52 G capsule Take 0.52 g by mouth 3 (three) times daily. Reported on 03/01/2016    . valsartan (DIOVAN) 320 MG tablet Take 320 mg by mouth daily.      . vitamin E 1000 UNIT capsule Take 1,000 Units by mouth daily.     No current facility-administered medications on file prior to visit.    Allergies  Allergen Reactions  . Penicillins Rash  . Shellfish Allergy Itching and Rash  . Sulfa Antibiotics Hives and Rash    Objective:  General: Alert and oriented x3 in no acute distress  Dermatology: Decreased focal Swelling, warmth, redness on the left fourth toethere is faint gouty tophi pressing against skin at the dorsal aspect of left fourth toe with no evidence of skin rupture or ulceration at this time, No open lesions  bilateral lower extremities, no webspace macerations, no ecchymosis bilateral, all nails x 10 are well manicured.  Vascular: Dorsalis Pedis and Posterior Tibial pedal pulses 1/4, Capillary Fill Time 3 seconds,(+) pedal hair growth bilateral,Temperature gradient within normal limits bilateral.  Neurology: Gross sensation intact via light touch bilateral, (- )Tinels sign left foot.   Musculoskeletal: There is decreased tenderness with palpation at fourth toe, left foot ,No pain with calf compression bilateral. All joint range of motion is within normal limits except at the left 4th toe where there is limitation due to gouty arthritis, Strength within normal limits in all groups bilateral.        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 -Laboratory results reveal, Uric acid 11 -Prescribed allopurinol for long term management of gout; Advised patient to follow up with primary care doctor for continued management and to consider possible rheumatology consultation for long term care -Patient to return to see me as needed or sooner if condition worsens.  Asencion Islamitorya Akili Corsetti, DPM

## 2016-04-25 NOTE — Patient Instructions (Signed)

## 2016-05-14 DIAGNOSIS — Z6841 Body Mass Index (BMI) 40.0 and over, adult: Secondary | ICD-10-CM | POA: Diagnosis not present

## 2016-05-14 DIAGNOSIS — M109 Gout, unspecified: Secondary | ICD-10-CM | POA: Diagnosis not present

## 2016-05-14 DIAGNOSIS — N183 Chronic kidney disease, stage 3 (moderate): Secondary | ICD-10-CM | POA: Diagnosis not present

## 2016-05-14 DIAGNOSIS — I10 Essential (primary) hypertension: Secondary | ICD-10-CM | POA: Diagnosis not present

## 2016-05-21 DIAGNOSIS — M109 Gout, unspecified: Secondary | ICD-10-CM | POA: Diagnosis not present

## 2016-05-21 DIAGNOSIS — Z6841 Body Mass Index (BMI) 40.0 and over, adult: Secondary | ICD-10-CM | POA: Diagnosis not present

## 2016-07-23 DIAGNOSIS — E1129 Type 2 diabetes mellitus with other diabetic kidney complication: Secondary | ICD-10-CM | POA: Diagnosis not present

## 2016-07-23 DIAGNOSIS — Z1231 Encounter for screening mammogram for malignant neoplasm of breast: Secondary | ICD-10-CM | POA: Diagnosis not present

## 2016-07-23 DIAGNOSIS — E785 Hyperlipidemia, unspecified: Secondary | ICD-10-CM | POA: Diagnosis not present

## 2016-07-23 DIAGNOSIS — I10 Essential (primary) hypertension: Secondary | ICD-10-CM | POA: Diagnosis not present

## 2016-07-23 DIAGNOSIS — N183 Chronic kidney disease, stage 3 (moderate): Secondary | ICD-10-CM | POA: Diagnosis not present

## 2016-07-23 DIAGNOSIS — D638 Anemia in other chronic diseases classified elsewhere: Secondary | ICD-10-CM | POA: Diagnosis not present

## 2016-07-23 DIAGNOSIS — M109 Gout, unspecified: Secondary | ICD-10-CM | POA: Diagnosis not present

## 2016-07-25 ENCOUNTER — Other Ambulatory Visit: Payer: Self-pay | Admitting: Internal Medicine

## 2016-07-25 DIAGNOSIS — Z1231 Encounter for screening mammogram for malignant neoplasm of breast: Secondary | ICD-10-CM

## 2016-08-06 DIAGNOSIS — E119 Type 2 diabetes mellitus without complications: Secondary | ICD-10-CM | POA: Diagnosis not present

## 2016-08-06 DIAGNOSIS — H2513 Age-related nuclear cataract, bilateral: Secondary | ICD-10-CM | POA: Diagnosis not present

## 2016-08-13 ENCOUNTER — Ambulatory Visit
Admission: RE | Admit: 2016-08-13 | Discharge: 2016-08-13 | Disposition: A | Discharge status: Home / Self Care | Payer: Medicare Other | Source: Ambulatory Visit | Attending: Internal Medicine | Admitting: Internal Medicine

## 2016-08-13 DIAGNOSIS — Z1231 Encounter for screening mammogram for malignant neoplasm of breast: Secondary | ICD-10-CM | POA: Diagnosis not present

## 2016-08-27 DIAGNOSIS — N189 Chronic kidney disease, unspecified: Secondary | ICD-10-CM | POA: Diagnosis not present

## 2016-08-27 DIAGNOSIS — D631 Anemia in chronic kidney disease: Secondary | ICD-10-CM | POA: Diagnosis not present

## 2016-09-07 DIAGNOSIS — D518 Other vitamin B12 deficiency anemias: Secondary | ICD-10-CM | POA: Diagnosis not present

## 2016-09-12 DIAGNOSIS — N189 Chronic kidney disease, unspecified: Secondary | ICD-10-CM | POA: Diagnosis not present

## 2016-09-12 DIAGNOSIS — D518 Other vitamin B12 deficiency anemias: Secondary | ICD-10-CM | POA: Diagnosis not present

## 2016-09-12 DIAGNOSIS — D631 Anemia in chronic kidney disease: Secondary | ICD-10-CM | POA: Diagnosis not present

## 2016-09-13 DIAGNOSIS — D631 Anemia in chronic kidney disease: Secondary | ICD-10-CM | POA: Diagnosis not present

## 2016-09-13 DIAGNOSIS — D518 Other vitamin B12 deficiency anemias: Secondary | ICD-10-CM | POA: Diagnosis not present

## 2016-09-13 DIAGNOSIS — N189 Chronic kidney disease, unspecified: Secondary | ICD-10-CM | POA: Diagnosis not present

## 2016-09-14 DIAGNOSIS — D518 Other vitamin B12 deficiency anemias: Secondary | ICD-10-CM | POA: Diagnosis not present

## 2016-09-17 DIAGNOSIS — D631 Anemia in chronic kidney disease: Secondary | ICD-10-CM | POA: Diagnosis not present

## 2016-09-17 DIAGNOSIS — D518 Other vitamin B12 deficiency anemias: Secondary | ICD-10-CM | POA: Diagnosis not present

## 2016-09-17 DIAGNOSIS — N189 Chronic kidney disease, unspecified: Secondary | ICD-10-CM | POA: Diagnosis not present

## 2016-09-18 DIAGNOSIS — D518 Other vitamin B12 deficiency anemias: Secondary | ICD-10-CM | POA: Diagnosis not present

## 2016-09-19 DIAGNOSIS — D518 Other vitamin B12 deficiency anemias: Secondary | ICD-10-CM | POA: Diagnosis not present

## 2016-09-26 DIAGNOSIS — D631 Anemia in chronic kidney disease: Secondary | ICD-10-CM | POA: Diagnosis not present

## 2016-09-26 DIAGNOSIS — D518 Other vitamin B12 deficiency anemias: Secondary | ICD-10-CM | POA: Diagnosis not present

## 2016-09-26 DIAGNOSIS — N189 Chronic kidney disease, unspecified: Secondary | ICD-10-CM | POA: Diagnosis not present

## 2016-10-03 DIAGNOSIS — D518 Other vitamin B12 deficiency anemias: Secondary | ICD-10-CM | POA: Diagnosis not present

## 2016-10-10 DIAGNOSIS — D631 Anemia in chronic kidney disease: Secondary | ICD-10-CM | POA: Diagnosis not present

## 2016-10-10 DIAGNOSIS — N189 Chronic kidney disease, unspecified: Secondary | ICD-10-CM | POA: Diagnosis not present

## 2016-10-10 DIAGNOSIS — D518 Other vitamin B12 deficiency anemias: Secondary | ICD-10-CM | POA: Diagnosis not present

## 2016-10-17 DIAGNOSIS — D518 Other vitamin B12 deficiency anemias: Secondary | ICD-10-CM | POA: Diagnosis not present

## 2016-10-29 DIAGNOSIS — M109 Gout, unspecified: Secondary | ICD-10-CM | POA: Diagnosis not present

## 2016-10-29 DIAGNOSIS — D638 Anemia in other chronic diseases classified elsewhere: Secondary | ICD-10-CM | POA: Diagnosis not present

## 2016-10-29 DIAGNOSIS — E1129 Type 2 diabetes mellitus with other diabetic kidney complication: Secondary | ICD-10-CM | POA: Diagnosis not present

## 2016-10-29 DIAGNOSIS — I129 Hypertensive chronic kidney disease with stage 1 through stage 4 chronic kidney disease, or unspecified chronic kidney disease: Secondary | ICD-10-CM | POA: Diagnosis not present

## 2016-10-29 DIAGNOSIS — E785 Hyperlipidemia, unspecified: Secondary | ICD-10-CM | POA: Diagnosis not present

## 2016-10-29 DIAGNOSIS — N183 Chronic kidney disease, stage 3 (moderate): Secondary | ICD-10-CM | POA: Diagnosis not present

## 2016-10-29 DIAGNOSIS — Z23 Encounter for immunization: Secondary | ICD-10-CM | POA: Diagnosis not present

## 2016-10-29 DIAGNOSIS — Z6841 Body Mass Index (BMI) 40.0 and over, adult: Secondary | ICD-10-CM | POA: Diagnosis not present

## 2016-11-14 DIAGNOSIS — D518 Other vitamin B12 deficiency anemias: Secondary | ICD-10-CM | POA: Diagnosis not present

## 2016-11-27 DIAGNOSIS — E538 Deficiency of other specified B group vitamins: Secondary | ICD-10-CM | POA: Diagnosis not present

## 2016-11-27 DIAGNOSIS — D631 Anemia in chronic kidney disease: Secondary | ICD-10-CM | POA: Diagnosis not present

## 2016-11-27 DIAGNOSIS — N189 Chronic kidney disease, unspecified: Secondary | ICD-10-CM | POA: Diagnosis not present

## 2016-11-29 DIAGNOSIS — N189 Chronic kidney disease, unspecified: Secondary | ICD-10-CM | POA: Diagnosis not present

## 2016-11-29 DIAGNOSIS — D631 Anemia in chronic kidney disease: Secondary | ICD-10-CM | POA: Diagnosis not present

## 2016-12-19 DIAGNOSIS — Z6841 Body Mass Index (BMI) 40.0 and over, adult: Secondary | ICD-10-CM | POA: Diagnosis not present

## 2016-12-19 DIAGNOSIS — M109 Gout, unspecified: Secondary | ICD-10-CM | POA: Diagnosis not present

## 2016-12-28 DIAGNOSIS — N189 Chronic kidney disease, unspecified: Secondary | ICD-10-CM | POA: Diagnosis not present

## 2016-12-28 DIAGNOSIS — D631 Anemia in chronic kidney disease: Secondary | ICD-10-CM | POA: Diagnosis not present

## 2017-03-28 DIAGNOSIS — E538 Deficiency of other specified B group vitamins: Secondary | ICD-10-CM | POA: Diagnosis not present

## 2017-03-28 DIAGNOSIS — D631 Anemia in chronic kidney disease: Secondary | ICD-10-CM | POA: Diagnosis not present

## 2017-03-28 DIAGNOSIS — N189 Chronic kidney disease, unspecified: Secondary | ICD-10-CM | POA: Diagnosis not present

## 2017-07-16 ENCOUNTER — Other Ambulatory Visit: Payer: Self-pay | Admitting: Internal Medicine

## 2017-07-16 DIAGNOSIS — Z1231 Encounter for screening mammogram for malignant neoplasm of breast: Secondary | ICD-10-CM

## 2017-08-15 ENCOUNTER — Ambulatory Visit
Admission: RE | Admit: 2017-08-15 | Discharge: 2017-08-15 | Disposition: A | Discharge status: Home / Self Care | Payer: Medicare Other | Source: Ambulatory Visit | Attending: Internal Medicine | Admitting: Internal Medicine

## 2017-08-15 DIAGNOSIS — Z1231 Encounter for screening mammogram for malignant neoplasm of breast: Secondary | ICD-10-CM

## 2017-09-20 DIAGNOSIS — N189 Chronic kidney disease, unspecified: Secondary | ICD-10-CM | POA: Diagnosis not present

## 2017-09-20 DIAGNOSIS — D631 Anemia in chronic kidney disease: Secondary | ICD-10-CM | POA: Diagnosis not present

## 2017-12-20 DIAGNOSIS — D631 Anemia in chronic kidney disease: Secondary | ICD-10-CM | POA: Diagnosis not present

## 2017-12-20 DIAGNOSIS — N189 Chronic kidney disease, unspecified: Secondary | ICD-10-CM | POA: Diagnosis not present

## 2018-03-20 DIAGNOSIS — N189 Chronic kidney disease, unspecified: Secondary | ICD-10-CM | POA: Diagnosis not present

## 2018-03-20 DIAGNOSIS — D631 Anemia in chronic kidney disease: Secondary | ICD-10-CM | POA: Diagnosis not present

## 2018-07-24 DIAGNOSIS — D631 Anemia in chronic kidney disease: Secondary | ICD-10-CM

## 2018-07-24 DIAGNOSIS — N189 Chronic kidney disease, unspecified: Secondary | ICD-10-CM | POA: Diagnosis not present

## 2018-09-15 ENCOUNTER — Other Ambulatory Visit: Payer: Self-pay | Admitting: Internal Medicine

## 2018-09-15 DIAGNOSIS — Z1231 Encounter for screening mammogram for malignant neoplasm of breast: Secondary | ICD-10-CM

## 2018-10-15 ENCOUNTER — Ambulatory Visit
Admission: RE | Admit: 2018-10-15 | Discharge: 2018-10-15 | Disposition: A | Discharge status: Home / Self Care | Payer: Medicare Other | Source: Ambulatory Visit | Attending: Internal Medicine | Admitting: Internal Medicine

## 2018-10-15 DIAGNOSIS — Z1231 Encounter for screening mammogram for malignant neoplasm of breast: Secondary | ICD-10-CM

## 2018-11-20 DIAGNOSIS — D631 Anemia in chronic kidney disease: Secondary | ICD-10-CM

## 2018-11-20 DIAGNOSIS — N189 Chronic kidney disease, unspecified: Secondary | ICD-10-CM | POA: Diagnosis not present

## 2019-05-26 DIAGNOSIS — D631 Anemia in chronic kidney disease: Secondary | ICD-10-CM

## 2019-05-26 DIAGNOSIS — N189 Chronic kidney disease, unspecified: Secondary | ICD-10-CM

## 2019-09-18 ENCOUNTER — Other Ambulatory Visit: Payer: Self-pay | Admitting: Internal Medicine

## 2019-09-18 DIAGNOSIS — Z1231 Encounter for screening mammogram for malignant neoplasm of breast: Secondary | ICD-10-CM

## 2019-11-02 ENCOUNTER — Ambulatory Visit
Admission: RE | Admit: 2019-11-02 | Discharge: 2019-11-02 | Disposition: A | Discharge status: Home / Self Care | Payer: Medicare Other | Source: Ambulatory Visit | Attending: Internal Medicine | Admitting: Internal Medicine

## 2019-11-02 ENCOUNTER — Other Ambulatory Visit: Payer: Self-pay

## 2019-11-02 DIAGNOSIS — Z1231 Encounter for screening mammogram for malignant neoplasm of breast: Secondary | ICD-10-CM

## 2019-11-30 DIAGNOSIS — D631 Anemia in chronic kidney disease: Secondary | ICD-10-CM | POA: Diagnosis not present

## 2019-11-30 DIAGNOSIS — N189 Chronic kidney disease, unspecified: Secondary | ICD-10-CM

## 2020-03-21 DIAGNOSIS — N184 Chronic kidney disease, stage 4 (severe): Secondary | ICD-10-CM | POA: Diagnosis not present

## 2020-03-21 DIAGNOSIS — D638 Anemia in other chronic diseases classified elsewhere: Secondary | ICD-10-CM | POA: Diagnosis not present

## 2020-03-21 DIAGNOSIS — E785 Hyperlipidemia, unspecified: Secondary | ICD-10-CM | POA: Diagnosis not present

## 2020-03-21 DIAGNOSIS — I129 Hypertensive chronic kidney disease with stage 1 through stage 4 chronic kidney disease, or unspecified chronic kidney disease: Secondary | ICD-10-CM | POA: Diagnosis not present

## 2020-03-21 DIAGNOSIS — M109 Gout, unspecified: Secondary | ICD-10-CM | POA: Diagnosis not present

## 2020-03-21 DIAGNOSIS — D519 Vitamin B12 deficiency anemia, unspecified: Secondary | ICD-10-CM | POA: Diagnosis not present

## 2020-03-21 DIAGNOSIS — E1129 Type 2 diabetes mellitus with other diabetic kidney complication: Secondary | ICD-10-CM | POA: Diagnosis not present

## 2020-03-29 DIAGNOSIS — M8589 Other specified disorders of bone density and structure, multiple sites: Secondary | ICD-10-CM | POA: Diagnosis not present

## 2020-03-30 DIAGNOSIS — E876 Hypokalemia: Secondary | ICD-10-CM | POA: Diagnosis not present

## 2020-04-11 ENCOUNTER — Ambulatory Visit: Payer: Medicare Other | Admitting: Podiatry

## 2020-04-11 DIAGNOSIS — N390 Urinary tract infection, site not specified: Secondary | ICD-10-CM | POA: Diagnosis not present

## 2020-05-02 DIAGNOSIS — E876 Hypokalemia: Secondary | ICD-10-CM | POA: Diagnosis not present

## 2020-05-09 ENCOUNTER — Ambulatory Visit: Payer: Medicare Other | Admitting: Podiatry

## 2020-05-17 ENCOUNTER — Other Ambulatory Visit: Payer: Self-pay

## 2020-05-17 ENCOUNTER — Ambulatory Visit: Payer: Medicare Other | Admitting: Podiatry

## 2020-05-17 DIAGNOSIS — E139 Other specified diabetes mellitus without complications: Secondary | ICD-10-CM | POA: Diagnosis not present

## 2020-05-17 DIAGNOSIS — M2042 Other hammer toe(s) (acquired), left foot: Secondary | ICD-10-CM | POA: Diagnosis not present

## 2020-06-02 DIAGNOSIS — D631 Anemia in chronic kidney disease: Secondary | ICD-10-CM | POA: Diagnosis not present

## 2020-06-02 DIAGNOSIS — N189 Chronic kidney disease, unspecified: Secondary | ICD-10-CM | POA: Diagnosis not present

## 2020-06-20 DIAGNOSIS — D638 Anemia in other chronic diseases classified elsewhere: Secondary | ICD-10-CM | POA: Diagnosis not present

## 2020-06-20 DIAGNOSIS — D519 Vitamin B12 deficiency anemia, unspecified: Secondary | ICD-10-CM | POA: Diagnosis not present

## 2020-06-20 DIAGNOSIS — E785 Hyperlipidemia, unspecified: Secondary | ICD-10-CM | POA: Diagnosis not present

## 2020-06-20 DIAGNOSIS — I129 Hypertensive chronic kidney disease with stage 1 through stage 4 chronic kidney disease, or unspecified chronic kidney disease: Secondary | ICD-10-CM | POA: Diagnosis not present

## 2020-06-20 DIAGNOSIS — M109 Gout, unspecified: Secondary | ICD-10-CM | POA: Diagnosis not present

## 2020-06-20 DIAGNOSIS — E1129 Type 2 diabetes mellitus with other diabetic kidney complication: Secondary | ICD-10-CM | POA: Diagnosis not present

## 2020-06-20 DIAGNOSIS — N184 Chronic kidney disease, stage 4 (severe): Secondary | ICD-10-CM | POA: Diagnosis not present

## 2020-06-22 DIAGNOSIS — D519 Vitamin B12 deficiency anemia, unspecified: Secondary | ICD-10-CM | POA: Diagnosis not present

## 2020-06-29 DIAGNOSIS — R600 Localized edema: Secondary | ICD-10-CM | POA: Diagnosis not present

## 2020-06-29 DIAGNOSIS — D519 Vitamin B12 deficiency anemia, unspecified: Secondary | ICD-10-CM | POA: Diagnosis not present

## 2020-07-03 NOTE — Progress Notes (Signed)
  Subjective:  Patient ID: Michele Stewart, female    DOB: 11-Oct-1941,  MRN: [id number]  Chief Complaint  Patient presents with  . foot exam    diabetic foot exam  . Foot Problem    Pt is concern of Lt 2nd toe abnormal curving -pt states,"it's sticking up." -pt dneis pain    79 y.o. female presents with the above complaint. History confirmed with patient.   Objective:  Physical Exam: warm, good capillary refill, nail exam normal nails without lesions, no trophic changes or ulcerative lesions. DP pulses palpable, PT pulses palpable and protective sensation intact Left Foot: normal exam, no swelling, tenderness, instability; ligaments intact, full range of motion of all ankle/foot joints.  Left second toe hammertoe deformity Right Foot: normal exam, no swelling, tenderness, instability; ligaments intact, full range of motion of all ankle/foot joints   No images are attached to the encounter.  Assessment:   1. 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 Hammer toe of left foot      Plan:  Patient was evaluated and treated and all questions answered.  DM without complication, hammertoes -Educated on diabetic foot care -Diabetic foot level 0 -Educated on etiology of hammertoes   No follow-ups on file.

## 2020-07-06 DIAGNOSIS — R600 Localized edema: Secondary | ICD-10-CM | POA: Diagnosis not present

## 2020-07-06 DIAGNOSIS — D519 Vitamin B12 deficiency anemia, unspecified: Secondary | ICD-10-CM | POA: Diagnosis not present

## 2020-07-06 DIAGNOSIS — I872 Venous insufficiency (chronic) (peripheral): Secondary | ICD-10-CM | POA: Diagnosis not present

## 2020-08-08 DIAGNOSIS — D519 Vitamin B12 deficiency anemia, unspecified: Secondary | ICD-10-CM | POA: Diagnosis not present

## 2020-09-08 DIAGNOSIS — D519 Vitamin B12 deficiency anemia, unspecified: Secondary | ICD-10-CM | POA: Diagnosis not present

## 2020-09-19 DIAGNOSIS — D519 Vitamin B12 deficiency anemia, unspecified: Secondary | ICD-10-CM | POA: Diagnosis not present

## 2020-09-19 DIAGNOSIS — M109 Gout, unspecified: Secondary | ICD-10-CM | POA: Diagnosis not present

## 2020-09-19 DIAGNOSIS — D638 Anemia in other chronic diseases classified elsewhere: Secondary | ICD-10-CM | POA: Diagnosis not present

## 2020-09-19 DIAGNOSIS — I129 Hypertensive chronic kidney disease with stage 1 through stage 4 chronic kidney disease, or unspecified chronic kidney disease: Secondary | ICD-10-CM | POA: Diagnosis not present

## 2020-09-19 DIAGNOSIS — N1831 Chronic kidney disease, stage 3a: Secondary | ICD-10-CM | POA: Diagnosis not present

## 2020-09-19 DIAGNOSIS — E785 Hyperlipidemia, unspecified: Secondary | ICD-10-CM | POA: Diagnosis not present

## 2020-09-19 DIAGNOSIS — Z6841 Body Mass Index (BMI) 40.0 and over, adult: Secondary | ICD-10-CM | POA: Diagnosis not present

## 2020-09-19 DIAGNOSIS — Z23 Encounter for immunization: Secondary | ICD-10-CM | POA: Diagnosis not present

## 2020-09-19 DIAGNOSIS — E1129 Type 2 diabetes mellitus with other diabetic kidney complication: Secondary | ICD-10-CM | POA: Diagnosis not present

## 2020-10-10 DIAGNOSIS — I129 Hypertensive chronic kidney disease with stage 1 through stage 4 chronic kidney disease, or unspecified chronic kidney disease: Secondary | ICD-10-CM | POA: Diagnosis not present

## 2020-10-10 DIAGNOSIS — D519 Vitamin B12 deficiency anemia, unspecified: Secondary | ICD-10-CM | POA: Diagnosis not present

## 2020-10-19 DIAGNOSIS — M8589 Other specified disorders of bone density and structure, multiple sites: Secondary | ICD-10-CM | POA: Diagnosis not present

## 2020-11-10 DIAGNOSIS — D519 Vitamin B12 deficiency anemia, unspecified: Secondary | ICD-10-CM | POA: Diagnosis not present

## 2020-12-01 ENCOUNTER — Other Ambulatory Visit: Payer: Self-pay | Admitting: Oncology

## 2020-12-01 DIAGNOSIS — N189 Chronic kidney disease, unspecified: Secondary | ICD-10-CM

## 2020-12-01 DIAGNOSIS — D631 Anemia in chronic kidney disease: Secondary | ICD-10-CM

## 2020-12-01 NOTE — Progress Notes (Signed)
[facility]  [address] [place],  [place]  [address] [phone]  Clinic Day:  12/02/2020  Referring physician: Paulina Fusi, MD   HISTORY OF PRESENT ILLNESS:  The patient is a 79 y.o. female with anemia secondary to chronic renal insufficiency.  The patient has not received any red cell shots recently as her hemoglobin has consistently been above 10.  She comes in today for routine follow-up.  Since her last visit, the patient has been doing okay.  She denies having increased fatigue or any overt forms of blood loss which concern her for progressive anemia.     PHYSICAL EXAM:  Blood pressure (!) 164/71, pulse 64, temperature 98.6 F (37 C), resp. rate 16, height 5\' 1"  (1.549 m), weight 217 lb (98.4 kg), SpO2 97 %. Wt Readings from Last 3 Encounters:  12/02/20 217 lb (98.4 kg)  09/17/12 195 lb 6.7 oz (88.6 kg)  09/09/12 196 lb 14.4 oz (89.3 kg)   Body mass index is 41 kg/m. Performance status (ECOG): 1 - Symptomatic but completely ambulatory Physical Exam Constitutional:      Appearance: Normal appearance. She is not ill-appearing.  HENT:     Mouth/Throat:     Mouth: Mucous membranes are moist.     Pharynx: Oropharynx is clear. No oropharyngeal exudate or posterior oropharyngeal erythema.  Cardiovascular:     Rate and Rhythm: Normal rate and regular rhythm.     Heart sounds: No murmur heard.  No friction rub. No gallop.   Pulmonary:     Effort: Pulmonary effort is normal. No respiratory distress.     Breath sounds: Normal breath sounds. No wheezing, rhonchi or rales.  Abdominal:     General: Bowel sounds are normal. There is no distension.     Palpations: Abdomen is soft. There is no mass.     Tenderness: There is no abdominal tenderness.  Musculoskeletal:        General: No swelling.     Right lower leg: No edema.     Left lower leg: No edema.  Lymphadenopathy:     Cervical: No cervical adenopathy.     Upper Body:      Right upper body: No supraclavicular or axillary adenopathy.     Left upper body: No supraclavicular or axillary adenopathy.     Lower Body: No right inguinal adenopathy. No left inguinal adenopathy.  Skin:    General: Skin is warm.     Coloration: Skin is not jaundiced.     Findings: No lesion or rash.  Neurological:     General: No focal deficit present.     Mental Status: She is alert and oriented to person, place, and time. Mental status is at baseline.     Cranial Nerves: Cranial nerves are intact.  Psychiatric:        Mood and Affect: Mood normal.        Behavior: Behavior normal.        Thought Content: Thought content normal.     LABS:   CBC Latest Ref Rng & Units 12/02/2020 04/04/2016 09/17/2012  WBC - 4.7 6.1 11.6(H)  Hemoglobin 12.0 - 16.0 10.4(A) 11.8 10.3(L)  Hematocrit 36 - 46 32(A) 36.1 30.6(L)  Platelets 150 - 399 182 216 261   CMP Latest Ref Rng & Units 12/02/2020 04/04/2016 09/16/2012  Glucose 65 - 99 mg/dL - 09/18/2012) 093(G)  BUN 4 - 21 18 25 21   Creatinine 0.5 - 1.1 1.3(A) 1.38(H) 1.19(H)  Sodium 137 - 147 139 145(H) 137  Potassium 3.4 - 5.3 3.7 3.9 3.1(L)  Chloride 99 - 108 98(A) 101 98  CO2 13 - 22 28(A) 25 29  Calcium 8.7 - 10.7 8.6(A) 8.7 8.6  Alkaline Phos 25 - 125 118 - -  AST 13 - 35 36(A) - -  ALT 7 - 35 16 - -    Ref. Range 12/02/2020 15:53  Iron Latest Ref Range: 28 - 170 ug/dL 98  UIBC Latest Units: ug/dL 751  TIBC Latest Ref Range: 250 - 450 ug/dL 700  Saturation Ratios Latest Ref Range: 10.4 - 31.8 % 32 (H)  Ferritin Latest Ref Range: 11 - 307 ng/mL 91    ASSESSMENT & PLAN:  Assessment/Plan:  A 79 y.o. female with anemia secondary to chronic renal insufficiency.  I am pleased as her hemoglobin remains above 10. This is likely because her kidney function has not declined significantly over time.  I am also pleased as she has no evidence of iron deficiency.  Red cell shot therapy will continue to be held.  As she is clinically doing well, I will see  her back in another 6 months for repeat clinical assessment. The patient understands all the plans discussed today and is in agreement with them.      Gal Feldhaus Kirby Funk, MD

## 2020-12-02 ENCOUNTER — Inpatient Hospital Stay (INDEPENDENT_AMBULATORY_CARE_PROVIDER_SITE_OTHER): Payer: Medicare PPO | Admitting: Oncology

## 2020-12-02 ENCOUNTER — Other Ambulatory Visit: Payer: Self-pay

## 2020-12-02 ENCOUNTER — Telehealth: Payer: Self-pay | Admitting: Oncology

## 2020-12-02 ENCOUNTER — Other Ambulatory Visit: Payer: Self-pay | Admitting: Hematology and Oncology

## 2020-12-02 ENCOUNTER — Other Ambulatory Visit: Payer: Self-pay | Admitting: Oncology

## 2020-12-02 ENCOUNTER — Inpatient Hospital Stay: Payer: Medicare PPO | Attending: Oncology

## 2020-12-02 VITALS — BP 164/71 | HR 64 | Temp 98.6°F | Resp 16 | Ht 61.0 in | Wt 217.0 lb

## 2020-12-02 DIAGNOSIS — D631 Anemia in chronic kidney disease: Secondary | ICD-10-CM | POA: Diagnosis not present

## 2020-12-02 DIAGNOSIS — Z0001 Encounter for general adult medical examination with abnormal findings: Secondary | ICD-10-CM | POA: Diagnosis not present

## 2020-12-02 DIAGNOSIS — N189 Chronic kidney disease, unspecified: Secondary | ICD-10-CM | POA: Insufficient documentation

## 2020-12-02 DIAGNOSIS — D649 Anemia, unspecified: Secondary | ICD-10-CM | POA: Diagnosis not present

## 2020-12-02 LAB — HEPATIC FUNCTION PANEL
ALT: 16 (ref 7–35)
AST: 36 — AB (ref 13–35)
Alkaline Phosphatase: 118 (ref 25–125)
Bilirubin, Total: 0.5

## 2020-12-02 LAB — BASIC METABOLIC PANEL
BUN: 18 (ref 4–21)
CO2: 28 — AB (ref 13–22)
Chloride: 98 — AB (ref 99–108)
Creatinine: 1.3 — AB (ref 0.5–1.1)
Glucose: 111
Potassium: 3.7 (ref 3.4–5.3)
Sodium: 139 (ref 137–147)

## 2020-12-02 LAB — FERRITIN: Ferritin: 91 ng/mL (ref 11–307)

## 2020-12-02 LAB — IRON AND TIBC
Iron: 98 ug/dL (ref 28–170)
Saturation Ratios: 32 % — ABNORMAL HIGH (ref 10.4–31.8)
TIBC: 303 ug/dL (ref 250–450)
UIBC: 205 ug/dL

## 2020-12-02 LAB — CBC AND DIFFERENTIAL
HCT: 32 — AB (ref 36–46)
Hemoglobin: 10.4 — AB (ref 12.0–16.0)
Neutrophils Absolute: 2.68
Platelets: 182 (ref 150–399)
WBC: 4.7

## 2020-12-02 LAB — CBC
MCV: 100 (ref 76–111)
RBC: 3.15 — AB (ref 3.87–5.11)

## 2020-12-02 LAB — COMPREHENSIVE METABOLIC PANEL
Albumin: 3.8 (ref 3.5–5.0)
Calcium: 8.6 — AB (ref 8.7–10.7)

## 2020-12-02 NOTE — Telephone Encounter (Signed)
Per 12/3 los appt given to patient.

## 2020-12-08 DIAGNOSIS — Z139 Encounter for screening, unspecified: Secondary | ICD-10-CM | POA: Diagnosis not present

## 2020-12-08 DIAGNOSIS — E669 Obesity, unspecified: Secondary | ICD-10-CM | POA: Diagnosis not present

## 2020-12-08 DIAGNOSIS — Z1331 Encounter for screening for depression: Secondary | ICD-10-CM | POA: Diagnosis not present

## 2020-12-08 DIAGNOSIS — Z9181 History of falling: Secondary | ICD-10-CM | POA: Diagnosis not present

## 2020-12-08 DIAGNOSIS — Z0000 Encounter for general adult medical examination without abnormal findings: Secondary | ICD-10-CM | POA: Diagnosis not present

## 2020-12-08 DIAGNOSIS — E785 Hyperlipidemia, unspecified: Secondary | ICD-10-CM | POA: Diagnosis not present

## 2020-12-19 DIAGNOSIS — Z1231 Encounter for screening mammogram for malignant neoplasm of breast: Secondary | ICD-10-CM | POA: Diagnosis not present

## 2020-12-19 DIAGNOSIS — I129 Hypertensive chronic kidney disease with stage 1 through stage 4 chronic kidney disease, or unspecified chronic kidney disease: Secondary | ICD-10-CM | POA: Diagnosis not present

## 2020-12-19 DIAGNOSIS — E785 Hyperlipidemia, unspecified: Secondary | ICD-10-CM | POA: Diagnosis not present

## 2020-12-19 DIAGNOSIS — D638 Anemia in other chronic diseases classified elsewhere: Secondary | ICD-10-CM | POA: Diagnosis not present

## 2020-12-19 DIAGNOSIS — D519 Vitamin B12 deficiency anemia, unspecified: Secondary | ICD-10-CM | POA: Diagnosis not present

## 2020-12-19 DIAGNOSIS — E1129 Type 2 diabetes mellitus with other diabetic kidney complication: Secondary | ICD-10-CM | POA: Diagnosis not present

## 2020-12-19 DIAGNOSIS — N1831 Chronic kidney disease, stage 3a: Secondary | ICD-10-CM | POA: Diagnosis not present

## 2020-12-19 DIAGNOSIS — M109 Gout, unspecified: Secondary | ICD-10-CM | POA: Diagnosis not present

## 2020-12-20 ENCOUNTER — Other Ambulatory Visit: Payer: Self-pay | Admitting: Internal Medicine

## 2020-12-20 DIAGNOSIS — Z1231 Encounter for screening mammogram for malignant neoplasm of breast: Secondary | ICD-10-CM

## 2021-01-04 DIAGNOSIS — N959 Unspecified menopausal and perimenopausal disorder: Secondary | ICD-10-CM | POA: Diagnosis not present

## 2021-01-04 DIAGNOSIS — M85852 Other specified disorders of bone density and structure, left thigh: Secondary | ICD-10-CM | POA: Diagnosis not present

## 2021-01-23 DIAGNOSIS — D519 Vitamin B12 deficiency anemia, unspecified: Secondary | ICD-10-CM | POA: Diagnosis not present

## 2021-02-06 ENCOUNTER — Ambulatory Visit: Payer: Medicare PPO

## 2021-02-24 DIAGNOSIS — D519 Vitamin B12 deficiency anemia, unspecified: Secondary | ICD-10-CM | POA: Diagnosis not present

## 2021-03-20 DIAGNOSIS — N1831 Chronic kidney disease, stage 3a: Secondary | ICD-10-CM | POA: Diagnosis not present

## 2021-03-20 DIAGNOSIS — M109 Gout, unspecified: Secondary | ICD-10-CM | POA: Diagnosis not present

## 2021-03-20 DIAGNOSIS — D519 Vitamin B12 deficiency anemia, unspecified: Secondary | ICD-10-CM | POA: Diagnosis not present

## 2021-03-20 DIAGNOSIS — E785 Hyperlipidemia, unspecified: Secondary | ICD-10-CM | POA: Diagnosis not present

## 2021-03-20 DIAGNOSIS — E1129 Type 2 diabetes mellitus with other diabetic kidney complication: Secondary | ICD-10-CM | POA: Diagnosis not present

## 2021-03-20 DIAGNOSIS — I129 Hypertensive chronic kidney disease with stage 1 through stage 4 chronic kidney disease, or unspecified chronic kidney disease: Secondary | ICD-10-CM | POA: Diagnosis not present

## 2021-03-20 DIAGNOSIS — D638 Anemia in other chronic diseases classified elsewhere: Secondary | ICD-10-CM | POA: Diagnosis not present

## 2021-03-23 ENCOUNTER — Other Ambulatory Visit: Payer: Self-pay

## 2021-03-23 ENCOUNTER — Ambulatory Visit
Admission: RE | Admit: 2021-03-23 | Discharge: 2021-03-23 | Disposition: A | Discharge status: Home / Self Care | Payer: Medicare PPO | Source: Ambulatory Visit | Attending: Internal Medicine | Admitting: Internal Medicine

## 2021-03-23 DIAGNOSIS — Z1231 Encounter for screening mammogram for malignant neoplasm of breast: Secondary | ICD-10-CM

## 2021-03-28 DIAGNOSIS — D519 Vitamin B12 deficiency anemia, unspecified: Secondary | ICD-10-CM | POA: Diagnosis not present

## 2021-04-28 DIAGNOSIS — M8589 Other specified disorders of bone density and structure, multiple sites: Secondary | ICD-10-CM | POA: Diagnosis not present

## 2021-04-28 DIAGNOSIS — D519 Vitamin B12 deficiency anemia, unspecified: Secondary | ICD-10-CM | POA: Diagnosis not present

## 2021-05-30 DIAGNOSIS — D519 Vitamin B12 deficiency anemia, unspecified: Secondary | ICD-10-CM | POA: Diagnosis not present

## 2021-06-01 NOTE — Progress Notes (Signed)
[facility]  [address] [place],  [place]  [address] [phone]  Clinic Day:  06/02/2020  Referring physician: Paulina Fusi, MD  This document serves as a record of services personally performed by Dequincy Kirby Funk, MD. It was created on their behalf by [facility] E, a trained medical scribe. The creation of this record is based on the scribe's personal observations and the provider's statements to them.  HISTORY OF PRESENT ILLNESS:  The patient is a 81 y.o. female with anemia secondary to chronic renal insufficiency.  The patient has not received any red cell shots recently as her hemoglobin has consistently been at or above 10.  She comes in today for routine follow-up.  Since her last visit, the patient has been doing okay.  She denies having increased fatigue or any overt forms of blood loss which concern her for progressive anemia.    PHYSICAL EXAM:  Blood pressure (!) 183/77, pulse 65, temperature 98.4 F (36.9 C), resp. rate 16, height 5\' 1"  (1.549 m), weight 207 lb 8 oz (94.1 kg), SpO2 98 %. Wt Readings from Last 3 Encounters:  06/02/21 207 lb 8 oz (94.1 kg)  12/02/20 217 lb (98.4 kg)  09/17/12 195 lb 6.7 oz (88.6 kg)   Body mass index is 39.21 kg/m. Performance status (ECOG): 1 - Symptomatic but completely ambulatory Physical Exam Constitutional:      Appearance: Normal appearance. She is not ill-appearing.  HENT:     Mouth/Throat:     Mouth: Mucous membranes are moist.     Pharynx: Oropharynx is clear. No oropharyngeal exudate or posterior oropharyngeal erythema.  Cardiovascular:     Rate and Rhythm: Normal rate and regular rhythm.     Heart sounds: No murmur heard. No friction rub. No gallop.   Pulmonary:     Effort: Pulmonary effort is normal. No respiratory distress.     Breath sounds: Normal breath sounds. No wheezing, rhonchi or rales.  Chest:  Breasts:     Right: No axillary adenopathy or supraclavicular  adenopathy.     Left: No axillary adenopathy or supraclavicular adenopathy.    Abdominal:     General: Bowel sounds are normal. There is no distension.     Palpations: Abdomen is soft. There is no mass.     Tenderness: There is no abdominal tenderness.  Musculoskeletal:        General: No swelling.     Right lower leg: No edema.     Left lower leg: No edema.  Lymphadenopathy:     Cervical: No cervical adenopathy.     Upper Body:     Right upper body: No supraclavicular or axillary adenopathy.     Left upper body: No supraclavicular or axillary adenopathy.     Lower Body: No right inguinal adenopathy. No left inguinal adenopathy.  Skin:    General: Skin is warm.     Coloration: Skin is not jaundiced.     Findings: No lesion or rash.  Neurological:     General: No focal deficit present.     Mental Status: She is alert and oriented to person, place, and time. Mental status is at baseline.     Cranial Nerves: Cranial nerves are intact.  Psychiatric:        Mood and Affect: Mood normal.        Behavior: Behavior normal.        Thought Content: Thought content normal.     LABS:  CBC Latest Ref Rng & Units 06/02/2021 12/02/2020 04/04/2016  WBC - 5.1 4.7 6.1  Hemoglobin 12.0 - 16.0 9.9(A) 10.4(A) 11.8  Hematocrit 36 - 46 29(A) 32(A) 36.1  Platelets 150 - 399 158 182 216   CMP Latest Ref Rng & Units 06/02/2021 12/02/2020 04/04/2016  Glucose 65 - 99 mg/dL - - 923(R)  BUN 4 - 21 22(A) 18 25  Creatinine 0.5 - 1.1 1.3(A) 1.3(A) 1.38(H)  Sodium 137 - 147 138 139 145(H)  Potassium 3.4 - 5.3 3.9 3.7 3.9  Chloride 99 - 108 99 98(A) 101  CO2 13 - 22 31(A) 28(A) 25  Calcium 8.7 - 10.7 9.4 8.6(A) 8.7  Alkaline Phos 25 - 125 124 118 -  AST 13 - 35 36(A) 36(A) -  ALT 7 - 35 17 16 -    Ref. Range 06/02/2021 15:40  Iron Latest Ref Range: 28 - 170 ug/dL 44  UIBC Latest Units: ug/dL 007  TIBC Latest Ref Range: 250 - 450 ug/dL 622  Saturation Ratios Latest Ref Range: 10.4 - 31.8 % 14  Ferritin  Latest Ref Range: 11 - 307 ng/mL 105    ASSESSMENT & PLAN:  Assessment/Plan:  A 80 y.o. female with anemia secondary to chronic renal insufficiency.  Her hemoglobin is just minimally below 10 today.  Her iron parameters remain normal.  Her kidney function is slightly suboptimal, but stable.   The patient feels fine to where she is not interested in a red cell shot.  I have no problem acquiescing to her wishes.  However, I will reassess her CBC in 3 months to ensure her hemoglobin has not fallen further.  I will see her back in another 6 months for repeat clinical assessment. The patient understands all the plans discussed today and is in agreement with them.     I, Foye Deer, am acting as scribe for Weston Settle, MD    I have reviewed this report as typed by the medical scribe, and it is complete and accurate.  Dequincy Kirby Funk, MD

## 2021-06-02 ENCOUNTER — Other Ambulatory Visit: Payer: Self-pay | Admitting: Oncology

## 2021-06-02 ENCOUNTER — Inpatient Hospital Stay: Payer: Medicare PPO

## 2021-06-02 ENCOUNTER — Other Ambulatory Visit: Payer: Self-pay

## 2021-06-02 ENCOUNTER — Encounter: Payer: Self-pay | Admitting: Oncology

## 2021-06-02 ENCOUNTER — Telehealth: Payer: Self-pay | Admitting: Oncology

## 2021-06-02 ENCOUNTER — Inpatient Hospital Stay: Payer: Medicare PPO | Attending: Oncology | Admitting: Oncology

## 2021-06-02 DIAGNOSIS — D649 Anemia, unspecified: Secondary | ICD-10-CM | POA: Diagnosis not present

## 2021-06-02 DIAGNOSIS — N189 Chronic kidney disease, unspecified: Secondary | ICD-10-CM

## 2021-06-02 DIAGNOSIS — D631 Anemia in chronic kidney disease: Secondary | ICD-10-CM | POA: Insufficient documentation

## 2021-06-02 LAB — BASIC METABOLIC PANEL
BUN: 22 — AB (ref 4–21)
CO2: 31 — AB (ref 13–22)
Chloride: 99 (ref 99–108)
Creatinine: 1.3 — AB (ref 0.5–1.1)
Glucose: 110
Potassium: 3.9 (ref 3.4–5.3)
Sodium: 138 (ref 137–147)

## 2021-06-02 LAB — IRON AND TIBC
Iron: 44 ug/dL (ref 28–170)
Saturation Ratios: 14 % (ref 10.4–31.8)
TIBC: 321 ug/dL (ref 250–450)
UIBC: 277 ug/dL

## 2021-06-02 LAB — CBC AND DIFFERENTIAL
HCT: 29 — AB (ref 36–46)
Hemoglobin: 9.9 — AB (ref 12.0–16.0)
Neutrophils Absolute: 3.11
Platelets: 158 (ref 150–399)
WBC: 5.1

## 2021-06-02 LAB — FERRITIN: Ferritin: 105 ng/mL (ref 11–307)

## 2021-06-02 LAB — COMPREHENSIVE METABOLIC PANEL
Albumin: 4 (ref 3.5–5.0)
Calcium: 9.4 (ref 8.7–10.7)

## 2021-06-02 LAB — CBC: RBC: 2.91 — AB (ref 3.87–5.11)

## 2021-06-02 LAB — HEPATIC FUNCTION PANEL
ALT: 17 (ref 7–35)
AST: 36 — AB (ref 13–35)
Alkaline Phosphatase: 124 (ref 25–125)
Bilirubin, Total: 0.5

## 2021-06-02 NOTE — Telephone Encounter (Signed)
Per 6/3 LOS, patient scheduled Sept/Dec Appt's.  Gave patient Appt Summary

## 2021-06-05 DIAGNOSIS — J302 Other seasonal allergic rhinitis: Secondary | ICD-10-CM | POA: Diagnosis not present

## 2021-06-19 DIAGNOSIS — N1831 Chronic kidney disease, stage 3a: Secondary | ICD-10-CM | POA: Diagnosis not present

## 2021-06-19 DIAGNOSIS — D519 Vitamin B12 deficiency anemia, unspecified: Secondary | ICD-10-CM | POA: Diagnosis not present

## 2021-06-19 DIAGNOSIS — E785 Hyperlipidemia, unspecified: Secondary | ICD-10-CM | POA: Diagnosis not present

## 2021-06-19 DIAGNOSIS — M109 Gout, unspecified: Secondary | ICD-10-CM | POA: Diagnosis not present

## 2021-06-19 DIAGNOSIS — E1129 Type 2 diabetes mellitus with other diabetic kidney complication: Secondary | ICD-10-CM | POA: Diagnosis not present

## 2021-06-19 DIAGNOSIS — D638 Anemia in other chronic diseases classified elsewhere: Secondary | ICD-10-CM | POA: Diagnosis not present

## 2021-06-19 DIAGNOSIS — I129 Hypertensive chronic kidney disease with stage 1 through stage 4 chronic kidney disease, or unspecified chronic kidney disease: Secondary | ICD-10-CM | POA: Diagnosis not present

## 2021-06-30 DIAGNOSIS — D519 Vitamin B12 deficiency anemia, unspecified: Secondary | ICD-10-CM | POA: Diagnosis not present

## 2021-07-25 DIAGNOSIS — I129 Hypertensive chronic kidney disease with stage 1 through stage 4 chronic kidney disease, or unspecified chronic kidney disease: Secondary | ICD-10-CM | POA: Diagnosis not present

## 2021-07-25 DIAGNOSIS — E785 Hyperlipidemia, unspecified: Secondary | ICD-10-CM | POA: Diagnosis not present

## 2021-07-25 DIAGNOSIS — D631 Anemia in chronic kidney disease: Secondary | ICD-10-CM | POA: Diagnosis not present

## 2021-07-25 DIAGNOSIS — M109 Gout, unspecified: Secondary | ICD-10-CM | POA: Diagnosis not present

## 2021-07-25 DIAGNOSIS — N1831 Chronic kidney disease, stage 3a: Secondary | ICD-10-CM | POA: Diagnosis not present

## 2021-07-25 DIAGNOSIS — E1122 Type 2 diabetes mellitus with diabetic chronic kidney disease: Secondary | ICD-10-CM | POA: Diagnosis not present

## 2021-07-28 IMAGING — MG MM DIGITAL SCREENING BILAT W/ TOMO AND CAD
8 series · 8 of 24 positions shown · non-contrast
Comparison: Previous exam(s).

CLINICAL DATA: Screening.

EXAM:
DIGITAL SCREENING BILATERAL MAMMOGRAM WITH TOMOSYNTHESIS AND CAD
TECHNIQUE: Bilateral screening digital craniocaudal and mediolateral oblique
mammograms were obtained. Bilateral screening digital breast
tomosynthesis was performed. The images were evaluated with
computer-aided detection.

[R CC synth-2D]
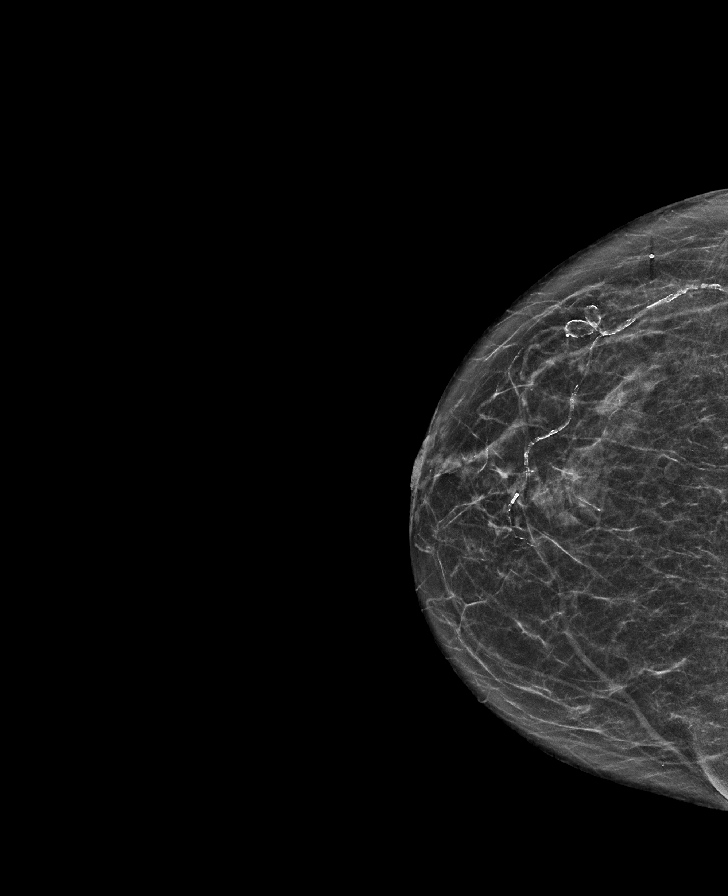

[L MLO synth-2D]
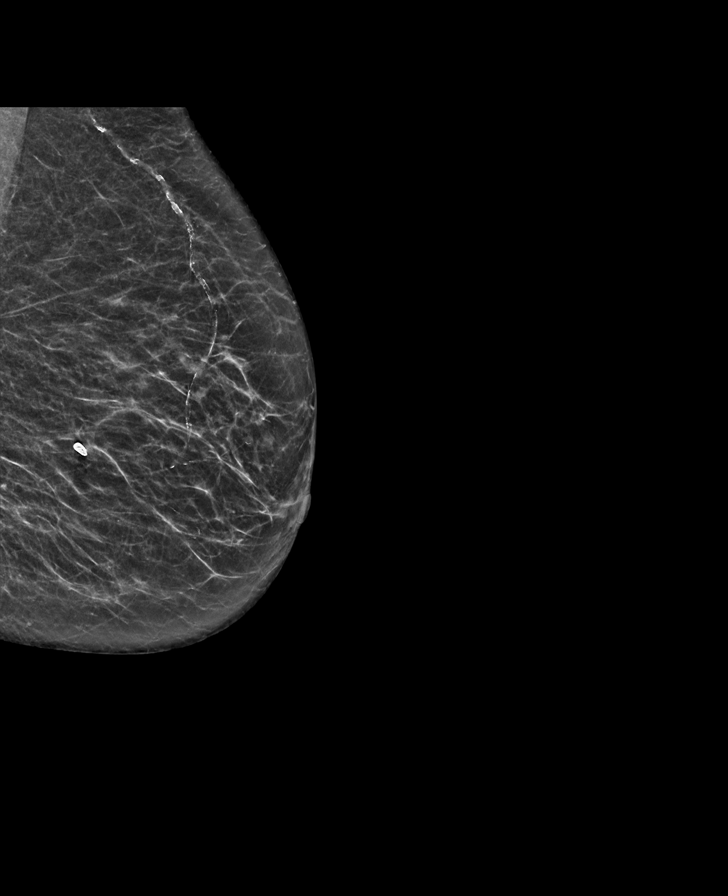

[L CC synth-2D]
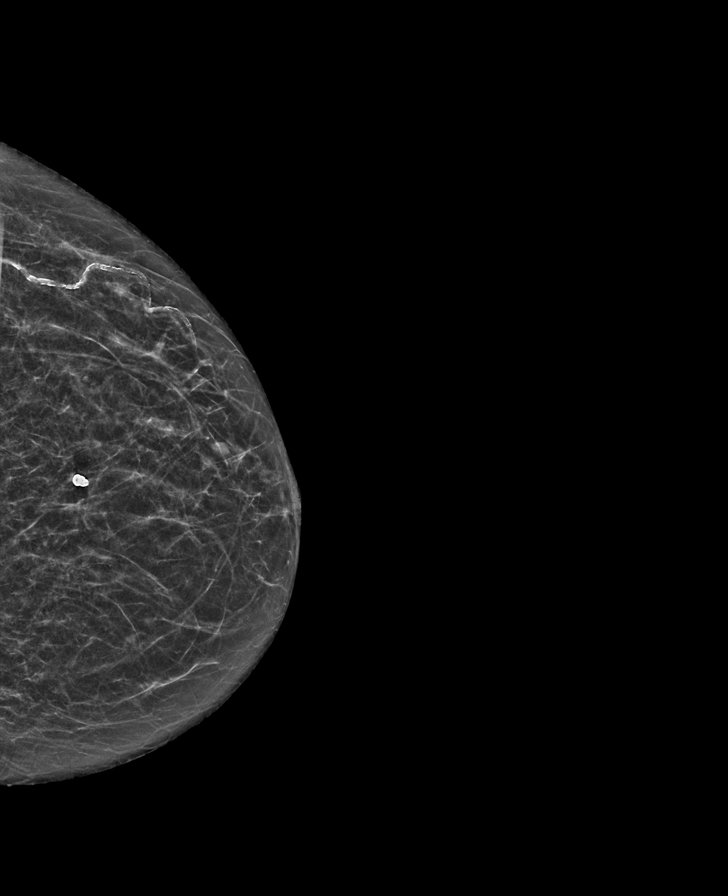

[R MLO synth-2D]
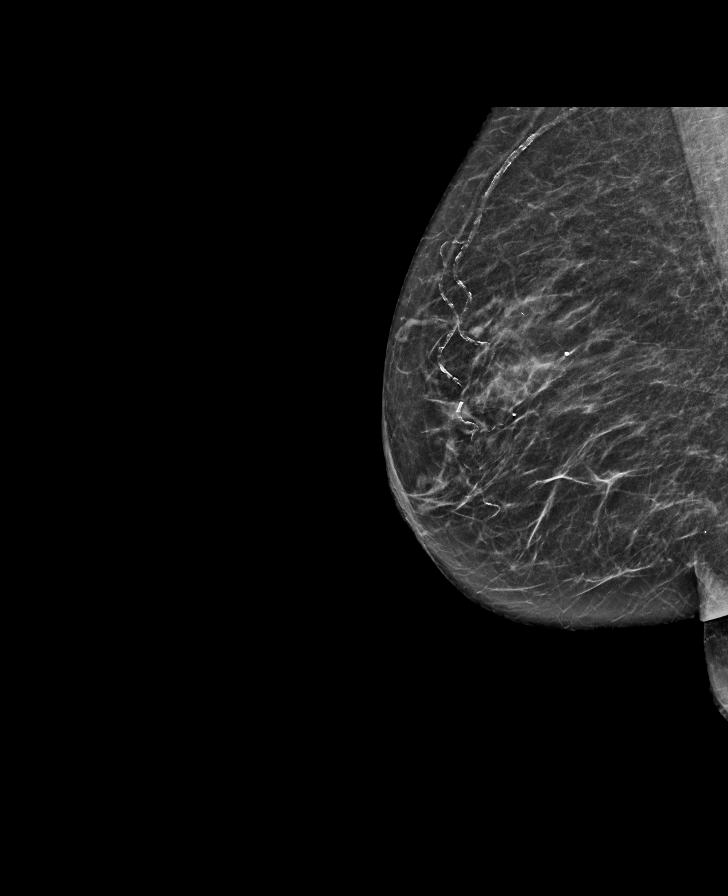

[L MLO tomo · tomo slice 29/57.0]
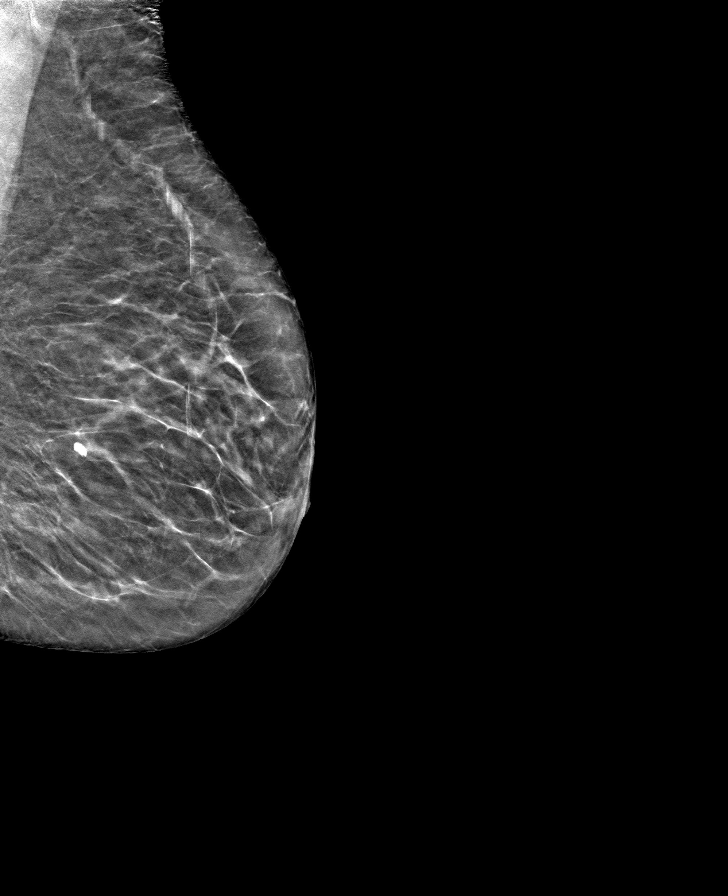

[R CC tomo · tomo slice 29/57.0]
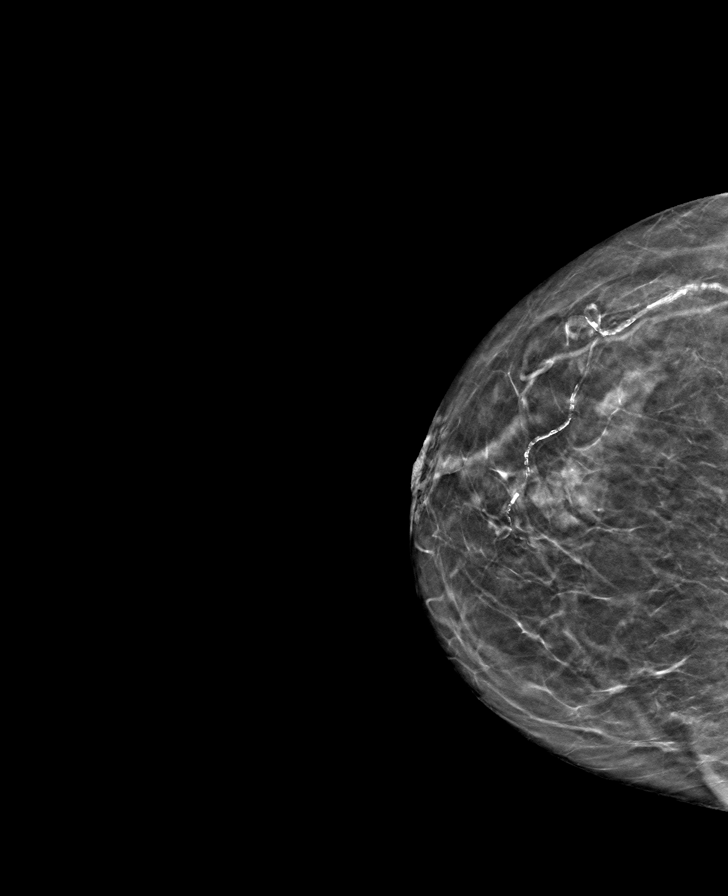

[R MLO tomo · tomo slice 31/60.0]
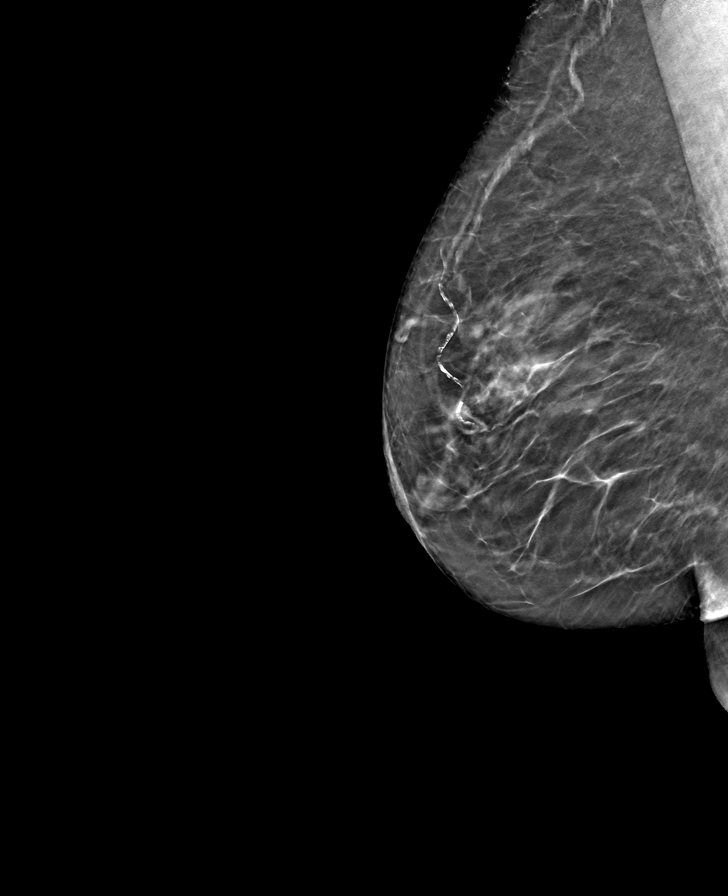

[L CC tomo · tomo slice 26/51.0]
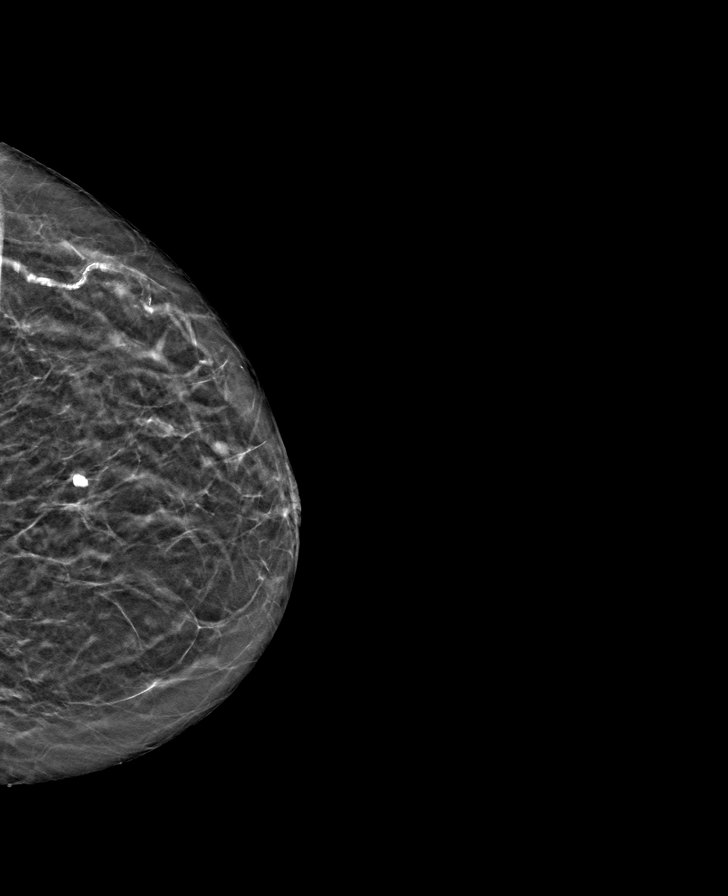

[8 of 24 positions shown; findings below may reference images not displayed]

ACR Breast Density Category b: There are scattered areas of
fibroglandular density.
FINDINGS: There are no findings suspicious for malignancy. The images were
evaluated with computer-aided detection.
IMPRESSION: No mammographic evidence of malignancy. A result letter of this
screening mammogram will be mailed directly to the patient.

RECOMMENDATION:
Screening mammogram in one year. (Code:WJ-I-BG6)

BI-RADS CATEGORY  1: Negative.

## 2021-08-01 DIAGNOSIS — D519 Vitamin B12 deficiency anemia, unspecified: Secondary | ICD-10-CM | POA: Diagnosis not present

## 2021-09-01 ENCOUNTER — Other Ambulatory Visit: Payer: Self-pay | Admitting: Hematology and Oncology

## 2021-09-01 ENCOUNTER — Inpatient Hospital Stay: Payer: Medicare PPO | Attending: Oncology

## 2021-09-01 DIAGNOSIS — D631 Anemia in chronic kidney disease: Secondary | ICD-10-CM | POA: Diagnosis not present

## 2021-09-01 DIAGNOSIS — N189 Chronic kidney disease, unspecified: Secondary | ICD-10-CM

## 2021-09-01 LAB — CBC AND DIFFERENTIAL
HCT: 29 — AB (ref 36–46)
Hemoglobin: 9.9 — AB (ref 12.0–16.0)
Neutrophils Absolute: 2.76
Platelets: 151 (ref 150–399)
WBC: 4.6

## 2021-09-01 LAB — CBC: RBC: 2.9 — AB (ref 3.87–5.11)

## 2021-09-05 ENCOUNTER — Other Ambulatory Visit: Payer: Self-pay | Admitting: Hematology and Oncology

## 2021-09-05 DIAGNOSIS — D519 Vitamin B12 deficiency anemia, unspecified: Secondary | ICD-10-CM | POA: Diagnosis not present

## 2021-09-05 NOTE — Progress Notes (Signed)
Spoke with patient regarding recent CBC revealing hemoglobin stable at 9.9. No changes to therapy at this time. She is to keep scheduled appointment with Dr. Melvyn Neth in 3 months. She verbalizes understanding.

## 2021-09-25 DIAGNOSIS — M109 Gout, unspecified: Secondary | ICD-10-CM | POA: Diagnosis not present

## 2021-09-25 DIAGNOSIS — E785 Hyperlipidemia, unspecified: Secondary | ICD-10-CM | POA: Diagnosis not present

## 2021-09-25 DIAGNOSIS — N1831 Chronic kidney disease, stage 3a: Secondary | ICD-10-CM | POA: Diagnosis not present

## 2021-09-25 DIAGNOSIS — Z23 Encounter for immunization: Secondary | ICD-10-CM | POA: Diagnosis not present

## 2021-09-25 DIAGNOSIS — D519 Vitamin B12 deficiency anemia, unspecified: Secondary | ICD-10-CM | POA: Diagnosis not present

## 2021-09-25 DIAGNOSIS — I129 Hypertensive chronic kidney disease with stage 1 through stage 4 chronic kidney disease, or unspecified chronic kidney disease: Secondary | ICD-10-CM | POA: Diagnosis not present

## 2021-09-25 DIAGNOSIS — E1129 Type 2 diabetes mellitus with other diabetic kidney complication: Secondary | ICD-10-CM | POA: Diagnosis not present

## 2021-09-25 DIAGNOSIS — D638 Anemia in other chronic diseases classified elsewhere: Secondary | ICD-10-CM | POA: Diagnosis not present

## 2021-10-05 DIAGNOSIS — D519 Vitamin B12 deficiency anemia, unspecified: Secondary | ICD-10-CM | POA: Diagnosis not present

## 2021-11-06 DIAGNOSIS — N390 Urinary tract infection, site not specified: Secondary | ICD-10-CM | POA: Diagnosis not present

## 2021-11-06 DIAGNOSIS — D519 Vitamin B12 deficiency anemia, unspecified: Secondary | ICD-10-CM | POA: Diagnosis not present

## 2021-11-14 DIAGNOSIS — M8589 Other specified disorders of bone density and structure, multiple sites: Secondary | ICD-10-CM | POA: Diagnosis not present

## 2021-11-21 DIAGNOSIS — N390 Urinary tract infection, site not specified: Secondary | ICD-10-CM | POA: Diagnosis not present

## 2021-11-22 NOTE — Progress Notes (Incomplete)
[facility]  [address] [place],  [place]  [address] [phone]  Clinic Day:  12/01/2021  Referring physician: Paulina Fusi, MD  This document serves as a record of services personally performed by Dequincy Kirby Funk, MD. It was created on their behalf by [facility] E, a trained medical scribe. The creation of this record is based on the scribe's personal observations and the provider's statements to them.  HISTORY OF PRESENT ILLNESS:  The patient is a 80 y.o. female with anemia secondary to chronic renal insufficiency.  The patient has not received any red cell shots recently as her hemoglobin has consistently been at or above 10.  She comes in today for routine follow-up.  Since her last visit, the patient has been doing okay.  She denies having increased fatigue or any overt forms of blood loss which concern her for progressive anemia.    PHYSICAL EXAM:  There were no vitals taken for this visit. Wt Readings from Last 3 Encounters:  06/02/21 207 lb 8 oz (94.1 kg)  12/02/20 217 lb (98.4 kg)  09/17/12 195 lb 6.7 oz (88.6 kg)   There is no height or weight on file to calculate BMI. Performance status (ECOG): 1 - Symptomatic but completely ambulatory Physical Exam Constitutional:      Appearance: Normal appearance. She is not ill-appearing.  HENT:     Mouth/Throat:     Mouth: Mucous membranes are moist.     Pharynx: Oropharynx is clear. No oropharyngeal exudate or posterior oropharyngeal erythema.  Cardiovascular:     Rate and Rhythm: Normal rate and regular rhythm.     Heart sounds: No murmur heard.   No friction rub. No gallop.  Pulmonary:     Effort: Pulmonary effort is normal. No respiratory distress.     Breath sounds: Normal breath sounds. No wheezing, rhonchi or rales.  Abdominal:     General: Bowel sounds are normal. There is no distension.     Palpations: Abdomen is soft. There is no mass.     Tenderness: There is no  abdominal tenderness.  Musculoskeletal:        General: No swelling.     Right lower leg: No edema.     Left lower leg: No edema.  Lymphadenopathy:     Cervical: No cervical adenopathy.     Upper Body:     Right upper body: No supraclavicular or axillary adenopathy.     Left upper body: No supraclavicular or axillary adenopathy.     Lower Body: No right inguinal adenopathy. No left inguinal adenopathy.  Skin:    General: Skin is warm.     Coloration: Skin is not jaundiced.     Findings: No lesion or rash.  Neurological:     General: No focal deficit present.     Mental Status: She is alert and oriented to person, place, and time. Mental status is at baseline.  Psychiatric:        Mood and Affect: Mood normal.        Behavior: Behavior normal.        Thought Content: Thought content normal.    LABS:   CBC Latest Ref Rng & Units 09/01/2021 06/02/2021 12/02/2020  WBC - 4.6 5.1 4.7  Hemoglobin 12.0 - 16.0 9.9(A) 9.9(A) 10.4(A)  Hematocrit 36 - 46 29(A) 29(A) 32(A)  Platelets 150 - 399 151 158 182   CMP Latest Ref Rng & Units 06/02/2021 12/02/2020 04/04/2016  Glucose 65 -  99 mg/dL - - 108(H)  BUN 4 - 21 22(A) 18 25  Creatinine 0.5 - 1.1 1.3(A) 1.3(A) 1.38(H)  Sodium 137 - 147 138 139 145(H)  Potassium 3.4 - 5.3 3.9 3.7 3.9  Chloride 99 - 108 99 98(A) 101  CO2 13 - 22 31(A) 28(A) 25  Calcium 8.7 - 10.7 9.4 8.6(A) 8.7  Alkaline Phos 25 - 125 124 118 -  AST 13 - 35 36(A) 36(A) -  ALT 7 - 35 17 16 -    Ref. Range 06/02/2021 15:40  Iron Latest Ref Range: 28 - 170 ug/dL 44  UIBC Latest Units: ug/dL 277  TIBC Latest Ref Range: 250 - 450 ug/dL 321  Saturation Ratios Latest Ref Range: 10.4 - 31.8 % 14  Ferritin Latest Ref Range: 11 - 307 ng/mL 105    ASSESSMENT & PLAN:  Assessment/Plan:  A 80 y.o. female with anemia secondary to chronic renal insufficiency.  Her hemoglobin is just minimally below 10 today.  Her iron parameters remain normal.  Her kidney function is slightly suboptimal,  but stable.   The patient feels fine to where she is not interested in a red cell shot.  I have no problem acquiescing to her wishes.  However, I will reassess her CBC in 3 months to ensure her hemoglobin has not fallen further.  I will see her back in another 6 months for repeat clinical assessment. The patient understands all the plans discussed today and is in agreement with them.     I, Rita Ohara, am acting as scribe for Marice Potter, MD    I have reviewed this report as typed by the medical scribe, and it is complete and accurate.  Dequincy Macarthur Critchley, MD

## 2021-11-30 ENCOUNTER — Telehealth: Payer: Self-pay | Admitting: Oncology

## 2021-11-30 NOTE — Telephone Encounter (Signed)
Per Dr Melvyn Neth, reschedule patient's Appt due to leaving early on 12/2.  Patient rescheduled to 12/5 Labs 3:45 pm - Follow Up 4:30 pm

## 2021-11-30 NOTE — Telephone Encounter (Signed)
Per Dr Melvyn Neth, reschedule patient to see [facility]

## 2021-12-01 ENCOUNTER — Ambulatory Visit: Payer: Medicare PPO | Admitting: Oncology

## 2021-12-01 ENCOUNTER — Other Ambulatory Visit: Payer: Medicare PPO

## 2021-12-01 NOTE — Progress Notes (Signed)
[facility]  [address] [place],    [address] [phone]  Clinic Day:  12/04/2020  Referring physician: Nicoletta Dress, MD  This document serves as a record of services personally performed by Hattie Aguinaldo Macarthur Critchley, MD. It was created on their behalf by [facility] E, a trained medical scribe. The creation of this record is based on the scribe's personal observations and the provider's statements to them.  HISTORY OF PRESENT ILLNESS:  The patient is a 80 y.o. female with anemia secondary to chronic renal insufficiency.  The patient has not received any red cell shots recently as her hemoglobin has consistently been near or above 10.  She comes in today for routine follow-up.  Since her last visit, the patient has been doing okay.  She denies having increased fatigue or any overt forms of blood loss which concern her for progressive anemia.    PHYSICAL EXAM:  Blood pressure (!) 176/72, pulse (!) 59, temperature 97.6 F (36.4 C), resp. rate 16, height 5\' 1"  (1.549 m), weight 204 lb 11.2 oz (92.9 kg), SpO2 99 %. Wt Readings from Last 3 Encounters:  12/04/21 204 lb 11.2 oz (92.9 kg)  06/02/21 207 lb 8 oz (94.1 kg)  12/02/20 217 lb (98.4 kg)   Body mass index is 38.68 kg/m. Performance status (ECOG): 1 - Symptomatic but completely ambulatory Physical Exam Constitutional:      Appearance: Normal appearance. She is not ill-appearing.  HENT:     Mouth/Throat:     Mouth: Mucous membranes are moist.     Pharynx: Oropharynx is clear. No oropharyngeal exudate or posterior oropharyngeal erythema.  Cardiovascular:     Rate and Rhythm: Regular rhythm. Bradycardia present.     Heart sounds: No murmur heard.   No friction rub. No gallop.  Pulmonary:     Effort: Pulmonary effort is normal. No respiratory distress.     Breath sounds: Normal breath sounds. No wheezing, rhonchi or rales.  Abdominal:     General: Bowel sounds are normal. There is no  distension.     Palpations: Abdomen is soft. There is no mass.     Tenderness: There is no abdominal tenderness.  Musculoskeletal:        General: No swelling.     Right lower leg: No edema.     Left lower leg: No edema.  Lymphadenopathy:     Cervical: No cervical adenopathy.     Upper Body:     Right upper body: No supraclavicular or axillary adenopathy.     Left upper body: No supraclavicular or axillary adenopathy.     Lower Body: No right inguinal adenopathy. No left inguinal adenopathy.  Skin:    General: Skin is warm.     Coloration: Skin is not jaundiced.     Findings: No lesion or rash.  Neurological:     General: No focal deficit present.     Mental Status: She is alert and oriented to person, place, and time. Mental status is at baseline.  Psychiatric:        Mood and Affect: Mood normal.        Behavior: Behavior normal.        Thought Content: Thought content normal.    LABS:   CBC Latest Ref Rng & Units 12/04/2021 09/01/2021 06/02/2021  WBC - 5.5 4.6 5.1  Hemoglobin 12.0 - 16.0 10.7(A) 9.9(A) 9.9(A)  Hematocrit 36 - 46 32(A) 29(A) 29(A)  Platelets 150 - 399 179 151  158   CMP Latest Ref Rng & Units 12/04/2021 06/02/2021 12/02/2020  Glucose 65 - 99 mg/dL - - -  BUN 4 - 21 87(G) 22(A) 18  Creatinine 0.5 - 1.1 1.4(A) 1.3(A) 1.3(A)  Sodium 137 - 147 138 138 139  Potassium 3.4 - 5.3 3.3(A) 3.9 3.7  Chloride 99 - 108 99 99 98(A)  CO2 13 - 22 31(A) 31(A) 28(A)  Calcium 8.7 - 10.7 9.0 9.4 8.6(A)  Alkaline Phos 25 - 125 124 124 118  AST 13 - 35 37(A) 36(A) 36(A)  ALT 7 - 35 19 17 16    ASSESSMENT & PLAN:  Assessment/Plan:  A 80 y.o. female with anemia secondary to chronic renal insufficiency.  I am pleased as her hemoglobin is above 10 today.  Her kidney function is slightly suboptimal, but stable.   Red cell shock therapy will continue to be held.  Clinically, the patient is doing well.  As that is the case, I will see her back in 6 months for repeat clinical assessment. The  patient understands all the plans discussed today and is in agreement with them.     I, 77, am acting as scribe for Foye Deer, MD    I have reviewed this report as typed by the medical scribe, and it is complete and accurate.  Kizzi Overbey Weston Settle, MD

## 2021-12-04 ENCOUNTER — Other Ambulatory Visit: Payer: Self-pay | Admitting: Oncology

## 2021-12-04 ENCOUNTER — Inpatient Hospital Stay: Payer: Medicare PPO | Attending: Oncology

## 2021-12-04 ENCOUNTER — Other Ambulatory Visit: Payer: Self-pay | Admitting: Hematology and Oncology

## 2021-12-04 ENCOUNTER — Telehealth: Payer: Self-pay | Admitting: Oncology

## 2021-12-04 ENCOUNTER — Inpatient Hospital Stay (INDEPENDENT_AMBULATORY_CARE_PROVIDER_SITE_OTHER): Payer: Medicare PPO | Admitting: Oncology

## 2021-12-04 VITALS — BP 176/72 | HR 59 | Temp 97.6°F | Resp 16 | Ht 61.0 in | Wt 204.7 lb

## 2021-12-04 DIAGNOSIS — D631 Anemia in chronic kidney disease: Secondary | ICD-10-CM

## 2021-12-04 DIAGNOSIS — N189 Chronic kidney disease, unspecified: Secondary | ICD-10-CM | POA: Insufficient documentation

## 2021-12-04 LAB — COMPREHENSIVE METABOLIC PANEL
Albumin: 3.9 (ref 3.5–5.0)
Calcium: 9 (ref 8.7–10.7)

## 2021-12-04 LAB — BASIC METABOLIC PANEL
BUN: 26 — AB (ref 4–21)
CO2: 31 — AB (ref 13–22)
Chloride: 99 (ref 99–108)
Creatinine: 1.4 — AB (ref 0.5–1.1)
Glucose: 116
Potassium: 3.3 — AB (ref 3.4–5.3)
Sodium: 138 (ref 137–147)

## 2021-12-04 LAB — CBC AND DIFFERENTIAL
HCT: 32 — AB (ref 36–46)
Hemoglobin: 10.7 — AB (ref 12.0–16.0)
Neutrophils Absolute: 3.69
Platelets: 179 (ref 150–399)
WBC: 5.5

## 2021-12-04 LAB — IRON AND TIBC
Iron: 61 ug/dL (ref 28–170)
Saturation Ratios: 20 % (ref 10.4–31.8)
TIBC: 303 ug/dL (ref 250–450)
UIBC: 242 ug/dL

## 2021-12-04 LAB — CBC: RBC: 3.2 — AB (ref 3.87–5.11)

## 2021-12-04 LAB — HEPATIC FUNCTION PANEL
ALT: 19 (ref 7–35)
AST: 37 — AB (ref 13–35)
Alkaline Phosphatase: 124 (ref 25–125)
Bilirubin, Total: 0.4

## 2021-12-04 LAB — FERRITIN: Ferritin: 102 ng/mL (ref 11–307)

## 2021-12-04 NOTE — Telephone Encounter (Signed)
Per 12/5 los next appt scheduled and given to patient 

## 2021-12-06 DIAGNOSIS — D519 Vitamin B12 deficiency anemia, unspecified: Secondary | ICD-10-CM | POA: Diagnosis not present

## 2021-12-13 DIAGNOSIS — Z6837 Body mass index (BMI) 37.0-37.9, adult: Secondary | ICD-10-CM | POA: Diagnosis not present

## 2021-12-13 DIAGNOSIS — Z9181 History of falling: Secondary | ICD-10-CM | POA: Diagnosis not present

## 2021-12-13 DIAGNOSIS — Z139 Encounter for screening, unspecified: Secondary | ICD-10-CM | POA: Diagnosis not present

## 2021-12-13 DIAGNOSIS — E669 Obesity, unspecified: Secondary | ICD-10-CM | POA: Diagnosis not present

## 2021-12-13 DIAGNOSIS — Z1331 Encounter for screening for depression: Secondary | ICD-10-CM | POA: Diagnosis not present

## 2021-12-13 DIAGNOSIS — Z0000 Encounter for general adult medical examination without abnormal findings: Secondary | ICD-10-CM | POA: Diagnosis not present

## 2021-12-13 DIAGNOSIS — E785 Hyperlipidemia, unspecified: Secondary | ICD-10-CM | POA: Diagnosis not present

## 2022-01-08 DIAGNOSIS — M109 Gout, unspecified: Secondary | ICD-10-CM | POA: Diagnosis not present

## 2022-01-08 DIAGNOSIS — I129 Hypertensive chronic kidney disease with stage 1 through stage 4 chronic kidney disease, or unspecified chronic kidney disease: Secondary | ICD-10-CM | POA: Diagnosis not present

## 2022-01-08 DIAGNOSIS — D519 Vitamin B12 deficiency anemia, unspecified: Secondary | ICD-10-CM | POA: Diagnosis not present

## 2022-01-08 DIAGNOSIS — Z1231 Encounter for screening mammogram for malignant neoplasm of breast: Secondary | ICD-10-CM | POA: Diagnosis not present

## 2022-01-08 DIAGNOSIS — E1129 Type 2 diabetes mellitus with other diabetic kidney complication: Secondary | ICD-10-CM | POA: Diagnosis not present

## 2022-01-08 DIAGNOSIS — E785 Hyperlipidemia, unspecified: Secondary | ICD-10-CM | POA: Diagnosis not present

## 2022-01-08 DIAGNOSIS — N1832 Chronic kidney disease, stage 3b: Secondary | ICD-10-CM | POA: Diagnosis not present

## 2022-01-08 DIAGNOSIS — D638 Anemia in other chronic diseases classified elsewhere: Secondary | ICD-10-CM | POA: Diagnosis not present

## 2022-02-08 DIAGNOSIS — D519 Vitamin B12 deficiency anemia, unspecified: Secondary | ICD-10-CM | POA: Diagnosis not present

## 2022-03-06 DIAGNOSIS — H1033 Unspecified acute conjunctivitis, bilateral: Secondary | ICD-10-CM | POA: Diagnosis not present

## 2022-03-06 DIAGNOSIS — Z961 Presence of intraocular lens: Secondary | ICD-10-CM | POA: Diagnosis not present

## 2022-03-06 DIAGNOSIS — H43392 Other vitreous opacities, left eye: Secondary | ICD-10-CM | POA: Diagnosis not present

## 2022-03-06 DIAGNOSIS — H02139 Senile ectropion of unspecified eye, unspecified eyelid: Secondary | ICD-10-CM | POA: Diagnosis not present

## 2022-03-06 DIAGNOSIS — H02889 Meibomian gland dysfunction of unspecified eye, unspecified eyelid: Secondary | ICD-10-CM | POA: Diagnosis not present

## 2022-03-06 DIAGNOSIS — H26493 Other secondary cataract, bilateral: Secondary | ICD-10-CM | POA: Diagnosis not present

## 2022-03-06 DIAGNOSIS — Z7984 Long term (current) use of oral hypoglycemic drugs: Secondary | ICD-10-CM | POA: Diagnosis not present

## 2022-03-06 DIAGNOSIS — E119 Type 2 diabetes mellitus without complications: Secondary | ICD-10-CM | POA: Diagnosis not present

## 2022-03-13 DIAGNOSIS — D519 Vitamin B12 deficiency anemia, unspecified: Secondary | ICD-10-CM | POA: Diagnosis not present

## 2022-03-20 DIAGNOSIS — H26493 Other secondary cataract, bilateral: Secondary | ICD-10-CM | POA: Diagnosis not present

## 2022-03-20 DIAGNOSIS — H1033 Unspecified acute conjunctivitis, bilateral: Secondary | ICD-10-CM | POA: Diagnosis not present

## 2022-03-20 DIAGNOSIS — H02139 Senile ectropion of unspecified eye, unspecified eyelid: Secondary | ICD-10-CM | POA: Diagnosis not present

## 2022-03-20 DIAGNOSIS — H02889 Meibomian gland dysfunction of unspecified eye, unspecified eyelid: Secondary | ICD-10-CM | POA: Diagnosis not present

## 2022-03-20 DIAGNOSIS — Z7984 Long term (current) use of oral hypoglycemic drugs: Secondary | ICD-10-CM | POA: Diagnosis not present

## 2022-03-20 DIAGNOSIS — H43392 Other vitreous opacities, left eye: Secondary | ICD-10-CM | POA: Diagnosis not present

## 2022-03-20 DIAGNOSIS — E119 Type 2 diabetes mellitus without complications: Secondary | ICD-10-CM | POA: Diagnosis not present

## 2022-03-20 DIAGNOSIS — Z961 Presence of intraocular lens: Secondary | ICD-10-CM | POA: Diagnosis not present

## 2022-04-09 DIAGNOSIS — D638 Anemia in other chronic diseases classified elsewhere: Secondary | ICD-10-CM | POA: Diagnosis not present

## 2022-04-09 DIAGNOSIS — M109 Gout, unspecified: Secondary | ICD-10-CM | POA: Diagnosis not present

## 2022-04-09 DIAGNOSIS — E1129 Type 2 diabetes mellitus with other diabetic kidney complication: Secondary | ICD-10-CM | POA: Diagnosis not present

## 2022-04-09 DIAGNOSIS — E785 Hyperlipidemia, unspecified: Secondary | ICD-10-CM | POA: Diagnosis not present

## 2022-04-09 DIAGNOSIS — N1832 Chronic kidney disease, stage 3b: Secondary | ICD-10-CM | POA: Diagnosis not present

## 2022-04-09 DIAGNOSIS — I129 Hypertensive chronic kidney disease with stage 1 through stage 4 chronic kidney disease, or unspecified chronic kidney disease: Secondary | ICD-10-CM | POA: Diagnosis not present

## 2022-04-09 DIAGNOSIS — D519 Vitamin B12 deficiency anemia, unspecified: Secondary | ICD-10-CM | POA: Diagnosis not present

## 2022-04-16 DIAGNOSIS — D519 Vitamin B12 deficiency anemia, unspecified: Secondary | ICD-10-CM | POA: Diagnosis not present

## 2022-04-18 ENCOUNTER — Other Ambulatory Visit: Payer: Self-pay | Admitting: Internal Medicine

## 2022-05-16 DIAGNOSIS — M8589 Other specified disorders of bone density and structure, multiple sites: Secondary | ICD-10-CM | POA: Diagnosis not present

## 2022-05-16 DIAGNOSIS — D519 Vitamin B12 deficiency anemia, unspecified: Secondary | ICD-10-CM | POA: Diagnosis not present

## 2022-06-03 NOTE — Progress Notes (Signed)
[facility]  [address] [place],  [place]  [address] [phone]  Clinic Day:  06/04/2022  Referring physician: Nicoletta Dress, MD  HISTORY OF PRESENT ILLNESS:  The patient is an 81 y.o. female with anemia secondary to chronic renal insufficiency.  The patient has not received any red cell shots anytime recently as her hemoglobin has consistently been near or above 10.  She comes in today for routine follow-up.  Since her last visit, the patient has been doing okay.  She denies having increased fatigue or any overt forms of blood loss which concern her for progressive anemia.    PHYSICAL EXAM:  Blood pressure (!) 165/72, pulse (!) 54, temperature 98.4 F (36.9 C), resp. rate 16, height 5\' 1"  (1.549 m), weight 198 lb 12.8 oz (90.2 kg), SpO2 98 %. Wt Readings from Last 3 Encounters:  06/04/22 198 lb 12.8 oz (90.2 kg)  12/04/21 204 lb 11.2 oz (92.9 kg)  06/02/21 207 lb 8 oz (94.1 kg)   Body mass index is 37.56 kg/m. Performance status (ECOG): 1 - Symptomatic but completely ambulatory Physical Exam Constitutional:      Appearance: Normal appearance. She is not ill-appearing.  HENT:     Mouth/Throat:     Mouth: Mucous membranes are moist.     Pharynx: Oropharynx is clear. No oropharyngeal exudate or posterior oropharyngeal erythema.  Cardiovascular:     Rate and Rhythm: Regular rhythm. Bradycardia present.     Heart sounds: No murmur heard.   No friction rub. No gallop.  Pulmonary:     Effort: Pulmonary effort is normal. No respiratory distress.     Breath sounds: Normal breath sounds. No wheezing, rhonchi or rales.  Abdominal:     General: Bowel sounds are normal. There is no distension.     Palpations: Abdomen is soft. There is no mass.     Tenderness: There is no abdominal tenderness.  Musculoskeletal:        General: No swelling.     Right lower leg: No edema.     Left lower leg: No edema.  Lymphadenopathy:     Cervical: No  cervical adenopathy.     Upper Body:     Right upper body: No supraclavicular or axillary adenopathy.     Left upper body: No supraclavicular or axillary adenopathy.     Lower Body: No right inguinal adenopathy. No left inguinal adenopathy.  Skin:    General: Skin is warm.     Coloration: Skin is not jaundiced.     Findings: No lesion or rash.  Neurological:     General: No focal deficit present.     Mental Status: She is alert and oriented to person, place, and time. Mental status is at baseline.  Psychiatric:        Mood and Affect: Mood normal.        Behavior: Behavior normal.        Thought Content: Thought content normal.    LABS:      Latest Ref Rng & Units 06/04/2022   12:00 AM 12/04/2021   12:00 AM 09/01/2021   12:00 AM  CBC  WBC  4.6      5.5      4.6       Hemoglobin 12.0 - 16.0 10.1      10.7      9.9       Hematocrit 36 - 46 31      32  29       Platelets 150 - 400 K/uL 147      179      151          This result is from an external source.      Latest Ref Rng & Units 06/04/2022   12:00 AM 12/04/2021   12:00 AM 06/02/2021   12:00 AM  CMP  BUN 4 - 21 28      26      22     Creatinine 0.5 - 1.1 1.2      1.4      1.3    Sodium 137 - 147 139      138      138    Potassium 3.5 - 5.1 mEq/L 3.5      3.3      3.9    Chloride 99 - 108 98      99      99    CO2 13 - 22 33      31      31    Calcium 8.7 - 10.7 8.9      9.0      9.4    Alkaline Phos 25 - 125 94      124      124    AST 13 - 35 35      37      36    ALT 7 - 35 U/L 18      19      17        This result is from an external source.    Latest Reference Range & Units 06/04/22 15:51  Iron 28 - 170 ug/dL 68  UIBC ug/dL 242  TIBC 250 - 450 ug/dL 310  Saturation Ratios 10.4 - 31.8 % 22  Ferritin 11 - 307 ng/mL 96   ASSESSMENT & PLAN:  Assessment/Plan:  An 81 y.o. female with anemia secondary to chronic renal insufficiency.  I am pleased as her hemoglobin remains above 10 today.  Her kidney function is  slightly suboptimal, but stable.  I am also pleased before normal iron parameters today.  Red cell shot therapy will continue to be held.  Clinically, the patient is doing well.  As that is the case, I will see her back in 6 months for repeat clinical assessment. The patient understands all the plans discussed today and is in agreement with them.    Naylani Bradner Macarthur Critchley, MD

## 2022-06-04 ENCOUNTER — Other Ambulatory Visit: Payer: Self-pay | Admitting: Oncology

## 2022-06-04 ENCOUNTER — Inpatient Hospital Stay: Payer: Medicare PPO

## 2022-06-04 ENCOUNTER — Inpatient Hospital Stay: Payer: Medicare PPO | Attending: Oncology | Admitting: Oncology

## 2022-06-04 ENCOUNTER — Telehealth: Payer: Self-pay | Admitting: Oncology

## 2022-06-04 VITALS — BP 165/72 | HR 54 | Temp 98.4°F | Resp 16 | Ht 61.0 in | Wt 198.8 lb

## 2022-06-04 DIAGNOSIS — D649 Anemia, unspecified: Secondary | ICD-10-CM | POA: Diagnosis not present

## 2022-06-04 DIAGNOSIS — D631 Anemia in chronic kidney disease: Secondary | ICD-10-CM

## 2022-06-04 DIAGNOSIS — N189 Chronic kidney disease, unspecified: Secondary | ICD-10-CM

## 2022-06-04 LAB — BASIC METABOLIC PANEL
BUN: 28 — AB (ref 4–21)
CO2: 33 — AB (ref 13–22)
Chloride: 98 — AB (ref 99–108)
Creatinine: 1.2 — AB (ref 0.5–1.1)
Glucose: 106
Potassium: 3.5 mEq/L (ref 3.5–5.1)
Sodium: 139 (ref 137–147)

## 2022-06-04 LAB — COMPREHENSIVE METABOLIC PANEL
Albumin: 4 (ref 3.5–5.0)
Calcium: 8.9 (ref 8.7–10.7)

## 2022-06-04 LAB — CBC AND DIFFERENTIAL
HCT: 31 — AB (ref 36–46)
Hemoglobin: 10.1 — AB (ref 12.0–16.0)
Neutrophils Absolute: 2.85
Platelets: 147 10*3/uL — AB (ref 150–400)
WBC: 4.6

## 2022-06-04 LAB — CBC: RBC: 3.12 — AB (ref 3.87–5.11)

## 2022-06-04 LAB — HEPATIC FUNCTION PANEL
ALT: 18 U/L (ref 7–35)
AST: 35 (ref 13–35)
Alkaline Phosphatase: 94 (ref 25–125)
Bilirubin, Total: 0.6

## 2022-06-04 LAB — IRON AND TIBC
Iron: 68 ug/dL (ref 28–170)
Saturation Ratios: 22 % (ref 10.4–31.8)
TIBC: 310 ug/dL (ref 250–450)
UIBC: 242 ug/dL

## 2022-06-04 LAB — FERRITIN: Ferritin: 96 ng/mL (ref 11–307)

## 2022-06-04 NOTE — Telephone Encounter (Signed)
Per 06/04/22 los next appt scheduled and confirmed with patient 

## 2022-06-18 DIAGNOSIS — D519 Vitamin B12 deficiency anemia, unspecified: Secondary | ICD-10-CM | POA: Diagnosis not present

## 2022-07-09 DIAGNOSIS — N1832 Chronic kidney disease, stage 3b: Secondary | ICD-10-CM | POA: Diagnosis not present

## 2022-07-09 DIAGNOSIS — E1129 Type 2 diabetes mellitus with other diabetic kidney complication: Secondary | ICD-10-CM | POA: Diagnosis not present

## 2022-07-09 DIAGNOSIS — M109 Gout, unspecified: Secondary | ICD-10-CM | POA: Diagnosis not present

## 2022-07-09 DIAGNOSIS — D519 Vitamin B12 deficiency anemia, unspecified: Secondary | ICD-10-CM | POA: Diagnosis not present

## 2022-07-09 DIAGNOSIS — D638 Anemia in other chronic diseases classified elsewhere: Secondary | ICD-10-CM | POA: Diagnosis not present

## 2022-07-09 DIAGNOSIS — E785 Hyperlipidemia, unspecified: Secondary | ICD-10-CM | POA: Diagnosis not present

## 2022-07-09 DIAGNOSIS — I129 Hypertensive chronic kidney disease with stage 1 through stage 4 chronic kidney disease, or unspecified chronic kidney disease: Secondary | ICD-10-CM | POA: Diagnosis not present

## 2022-07-11 ENCOUNTER — Other Ambulatory Visit: Payer: Self-pay | Admitting: Internal Medicine

## 2022-07-25 DIAGNOSIS — D519 Vitamin B12 deficiency anemia, unspecified: Secondary | ICD-10-CM | POA: Diagnosis not present

## 2022-07-30 DIAGNOSIS — I129 Hypertensive chronic kidney disease with stage 1 through stage 4 chronic kidney disease, or unspecified chronic kidney disease: Secondary | ICD-10-CM | POA: Diagnosis not present

## 2022-07-30 DIAGNOSIS — E785 Hyperlipidemia, unspecified: Secondary | ICD-10-CM | POA: Diagnosis not present

## 2022-07-30 DIAGNOSIS — N1831 Chronic kidney disease, stage 3a: Secondary | ICD-10-CM | POA: Diagnosis not present

## 2022-07-30 DIAGNOSIS — D631 Anemia in chronic kidney disease: Secondary | ICD-10-CM | POA: Diagnosis not present

## 2022-07-30 DIAGNOSIS — K279 Peptic ulcer, site unspecified, unspecified as acute or chronic, without hemorrhage or perforation: Secondary | ICD-10-CM | POA: Diagnosis not present

## 2022-07-30 DIAGNOSIS — E1122 Type 2 diabetes mellitus with diabetic chronic kidney disease: Secondary | ICD-10-CM | POA: Diagnosis not present

## 2022-08-29 DIAGNOSIS — D519 Vitamin B12 deficiency anemia, unspecified: Secondary | ICD-10-CM | POA: Diagnosis not present

## 2022-10-08 DIAGNOSIS — D638 Anemia in other chronic diseases classified elsewhere: Secondary | ICD-10-CM | POA: Diagnosis not present

## 2022-10-08 DIAGNOSIS — D519 Vitamin B12 deficiency anemia, unspecified: Secondary | ICD-10-CM | POA: Diagnosis not present

## 2022-10-08 DIAGNOSIS — E1129 Type 2 diabetes mellitus with other diabetic kidney complication: Secondary | ICD-10-CM | POA: Diagnosis not present

## 2022-10-08 DIAGNOSIS — I129 Hypertensive chronic kidney disease with stage 1 through stage 4 chronic kidney disease, or unspecified chronic kidney disease: Secondary | ICD-10-CM | POA: Diagnosis not present

## 2022-10-08 DIAGNOSIS — M109 Gout, unspecified: Secondary | ICD-10-CM | POA: Diagnosis not present

## 2022-10-08 DIAGNOSIS — Z23 Encounter for immunization: Secondary | ICD-10-CM | POA: Diagnosis not present

## 2022-10-08 DIAGNOSIS — E785 Hyperlipidemia, unspecified: Secondary | ICD-10-CM | POA: Diagnosis not present

## 2022-10-08 DIAGNOSIS — N1832 Chronic kidney disease, stage 3b: Secondary | ICD-10-CM | POA: Diagnosis not present

## 2022-11-21 DIAGNOSIS — M8589 Other specified disorders of bone density and structure, multiple sites: Secondary | ICD-10-CM | POA: Diagnosis not present

## 2022-11-21 DIAGNOSIS — D519 Vitamin B12 deficiency anemia, unspecified: Secondary | ICD-10-CM | POA: Diagnosis not present

## 2022-12-04 ENCOUNTER — Telehealth: Payer: Self-pay | Admitting: Oncology

## 2022-12-04 ENCOUNTER — Inpatient Hospital Stay: Payer: Medicare PPO

## 2022-12-04 ENCOUNTER — Inpatient Hospital Stay: Payer: Medicare PPO | Attending: Oncology | Admitting: Oncology

## 2022-12-04 VITALS — BP 177/72 | HR 62 | Temp 98.3°F | Resp 18 | Ht 61.0 in | Wt 202.4 lb

## 2022-12-04 DIAGNOSIS — D631 Anemia in chronic kidney disease: Secondary | ICD-10-CM

## 2022-12-04 DIAGNOSIS — N189 Chronic kidney disease, unspecified: Secondary | ICD-10-CM | POA: Diagnosis not present

## 2022-12-04 DIAGNOSIS — D649 Anemia, unspecified: Secondary | ICD-10-CM | POA: Diagnosis not present

## 2022-12-04 LAB — CBC: RBC: 2.98 — AB (ref 3.87–5.11)

## 2022-12-04 LAB — CMP (CANCER CENTER ONLY)
ALT: 16 U/L (ref 0–44)
AST: 29 U/L (ref 15–41)
Albumin: 3.8 g/dL (ref 3.5–5.0)
Alkaline Phosphatase: 80 U/L (ref 38–126)
Anion gap: 8 (ref 5–15)
BUN: 21 mg/dL (ref 8–23)
CO2: 29 mmol/L (ref 22–32)
Calcium: 9 mg/dL (ref 8.9–10.3)
Chloride: 104 mmol/L (ref 98–111)
Creatinine: 1.15 mg/dL — ABNORMAL HIGH (ref 0.44–1.00)
GFR, Estimated: 48 mL/min — ABNORMAL LOW (ref >60)
Glucose, Bld: 115 mg/dL — ABNORMAL HIGH (ref 70–99)
Potassium: 3.6 mmol/L (ref 3.5–5.1)
Sodium: 141 mmol/L (ref 135–145)
Total Bilirubin: 0.5 mg/dL (ref 0.3–1.2)
Total Protein: 6.7 g/dL (ref 6.5–8.1)

## 2022-12-04 LAB — FOLATE: Folate: 19.8 ng/mL (ref >5.9)

## 2022-12-04 LAB — IRON AND TIBC
Iron: 68 ug/dL (ref 28–170)
Saturation Ratios: 23 % (ref 10.4–31.8)
TIBC: 299 ug/dL (ref 250–450)
UIBC: 231 ug/dL

## 2022-12-04 LAB — CBC AND DIFFERENTIAL
HCT: 30 — AB (ref 36–46)
Hemoglobin: 10.1 — AB (ref 12.0–16.0)
Neutrophils Absolute: 2.73
Platelets: 150 10*3/uL (ref 150–400)
WBC: 4.7

## 2022-12-04 LAB — VITAMIN B12: Vitamin B-12: 660 pg/mL (ref 180–914)

## 2022-12-04 LAB — FERRITIN: Ferritin: 86 ng/mL (ref 11–307)

## 2022-12-04 NOTE — Telephone Encounter (Signed)
Patient has been scheduled for follow-up visit per 12/04/22 los. Pt given an appt calendar with date and time.  

## 2022-12-04 NOTE — Progress Notes (Signed)
[facility]  [address] [place],  [place]  [address] [phone]  Clinic Day:  12/04/2022  Referring physician: Paulina Fusi, MD  HISTORY OF PRESENT ILLNESS:  The patient is an 81 y.o. female with anemia secondary to chronic renal insufficiency.  The patient has not received any red cell shots in years as her hemoglobin has consistently been near or above 10.  She comes in today for routine follow-up.  Since her last visit, the patient has been doing okay.  She denies having increased fatigue or any overt forms of blood loss which concern her for progressive anemia.    PHYSICAL EXAM:  Blood pressure (!) 177/72, pulse 62, temperature 98.3 F (36.8 C), temperature source Oral, resp. rate 18, height 5\' 1"  (1.549 m), weight 202 lb 6.4 oz (91.8 kg), SpO2 96 %. Wt Readings from Last 3 Encounters:  12/04/22 202 lb 6.4 oz (91.8 kg)  06/04/22 198 lb 12.8 oz (90.2 kg)  12/04/21 204 lb 11.2 oz (92.9 kg)   Body mass index is 38.24 kg/m. Performance status (ECOG): 1 - Symptomatic but completely ambulatory Physical Exam Constitutional:      Appearance: Normal appearance. She is not ill-appearing.  HENT:     Mouth/Throat:     Mouth: Mucous membranes are moist.     Pharynx: Oropharynx is clear. No oropharyngeal exudate or posterior oropharyngeal erythema.  Cardiovascular:     Rate and Rhythm: Regular rhythm. Bradycardia present.     Heart sounds: No murmur heard.    No friction rub. No gallop.  Pulmonary:     Effort: Pulmonary effort is normal. No respiratory distress.     Breath sounds: Normal breath sounds. No wheezing, rhonchi or rales.  Abdominal:     General: Bowel sounds are normal. There is no distension.     Palpations: Abdomen is soft. There is no mass.     Tenderness: There is no abdominal tenderness.  Musculoskeletal:        General: No swelling.     Right lower leg: No edema.     Left lower leg: No edema.  Lymphadenopathy:      Cervical: No cervical adenopathy.     Upper Body:     Right upper body: No supraclavicular or axillary adenopathy.     Left upper body: No supraclavicular or axillary adenopathy.     Lower Body: No right inguinal adenopathy. No left inguinal adenopathy.  Skin:    General: Skin is warm.     Coloration: Skin is not jaundiced.     Findings: No lesion or rash.  Neurological:     General: No focal deficit present.     Mental Status: She is alert and oriented to person, place, and time. Mental status is at baseline.  Psychiatric:        Mood and Affect: Mood normal.        Behavior: Behavior normal.        Thought Content: Thought content normal.     LABS:       Latest Ref Rng & Units 06/04/2022   12:00 AM 12/04/2021   12:00 AM 09/01/2021   12:00 AM  CBC  WBC  4.6     5.5     4.6      Hemoglobin 12.0 - 16.0 10.1     10.7     9.9      Hematocrit 36 - 46 31     32  29      Platelets 150 - 400 K/uL 147     179     151         This result is from an external source.      Latest Ref Rng & Units 12/04/2022    3:41 PM 06/04/2022   12:00 AM 12/04/2021   12:00 AM  CMP  Glucose 70 - 99 mg/dL 022     BUN 8 - 23 mg/dL 21  28     26       Creatinine 0.44 - 1.00 mg/dL  1.2     1.4      Sodium 135 - 145 mmol/L 141  139     138      Potassium 3.5 - 5.1 mmol/L 3.6  3.5     3.3      Chloride 98 - 111 mmol/L 104  98     99      CO2 22 - 32 mmol/L 29  33     31      Calcium 8.9 - 10.3 mg/dL 9.0  8.9     9.0      Total Protein 6.5 - 8.1 g/dL 6.7     Total Bilirubin 0.3 - 1.2 mg/dL 0.5     Alkaline Phos 38 - 126 U/L 80  94     124      AST 15 - 41 U/L 29  35     37      ALT 0 - 44 U/L 16  18     19          This result is from an external source.    Latest Reference Range & Units 12/04/22 15:41 12/04/22 15:42  Iron 28 - 170 ug/dL 68   UIBC ug/dL 14/05/23   TIBC 14/05/23 - 122 ug/dL 449   Saturation Ratios 10.4 - 31.8 % 23   Ferritin 11 - 307 ng/mL 86   Folate >5.9 ng/mL  19.8  Vitamin B12 180  - 914 pg/mL 660     ASSESSMENT & PLAN:  Assessment/Plan:  An 81 y.o. female with anemia secondary to chronic renal insufficiency.  I am pleased as her hemoglobin remains at 10 today.  Her kidney function is slightly suboptimal, but stable.  I am also pleased as there is no evidence of any nutritional deficiencies.  Red cell shot therapy will continue to be held.  Clinically, the patient is doing well.  As that is the case, I will see her back in 6 months for repeat clinical assessment. The patient understands all the plans discussed today and is in agreement with them.    Bridey Brookover 005, MD

## 2022-12-14 DIAGNOSIS — J069 Acute upper respiratory infection, unspecified: Secondary | ICD-10-CM | POA: Diagnosis not present

## 2022-12-26 DIAGNOSIS — D519 Vitamin B12 deficiency anemia, unspecified: Secondary | ICD-10-CM | POA: Diagnosis not present

## 2023-01-14 DIAGNOSIS — Z139 Encounter for screening, unspecified: Secondary | ICD-10-CM | POA: Diagnosis not present

## 2023-01-14 DIAGNOSIS — Z9181 History of falling: Secondary | ICD-10-CM | POA: Diagnosis not present

## 2023-01-14 DIAGNOSIS — D638 Anemia in other chronic diseases classified elsewhere: Secondary | ICD-10-CM | POA: Diagnosis not present

## 2023-01-14 DIAGNOSIS — E785 Hyperlipidemia, unspecified: Secondary | ICD-10-CM | POA: Diagnosis not present

## 2023-01-14 DIAGNOSIS — D519 Vitamin B12 deficiency anemia, unspecified: Secondary | ICD-10-CM | POA: Diagnosis not present

## 2023-01-14 DIAGNOSIS — I129 Hypertensive chronic kidney disease with stage 1 through stage 4 chronic kidney disease, or unspecified chronic kidney disease: Secondary | ICD-10-CM | POA: Diagnosis not present

## 2023-01-14 DIAGNOSIS — E1129 Type 2 diabetes mellitus with other diabetic kidney complication: Secondary | ICD-10-CM | POA: Diagnosis not present

## 2023-01-14 DIAGNOSIS — N1832 Chronic kidney disease, stage 3b: Secondary | ICD-10-CM | POA: Diagnosis not present

## 2023-01-14 DIAGNOSIS — M109 Gout, unspecified: Secondary | ICD-10-CM | POA: Diagnosis not present

## 2023-01-23 ENCOUNTER — Ambulatory Visit: Payer: Medicare PPO | Admitting: Podiatry

## 2023-01-28 ENCOUNTER — Ambulatory Visit: Payer: Medicare PPO | Admitting: Podiatry

## 2023-01-28 DIAGNOSIS — B351 Tinea unguium: Secondary | ICD-10-CM | POA: Diagnosis not present

## 2023-01-28 DIAGNOSIS — M79675 Pain in left toe(s): Secondary | ICD-10-CM | POA: Diagnosis not present

## 2023-01-28 DIAGNOSIS — M79674 Pain in right toe(s): Secondary | ICD-10-CM

## 2023-01-28 DIAGNOSIS — E119 Type 2 diabetes mellitus without complications: Secondary | ICD-10-CM

## 2023-01-28 NOTE — Progress Notes (Signed)
  Subjective:  Patient ID: Michele Stewart, female    DOB: 1941-05-11,  MRN: [id number]  Chief Complaint  Patient presents with   Nail Problem    Nail trim     82 y.o. female presents with the above complaint. History confirmed with patient. Patient presenting with pain related to dystrophic thickened elongated nails. Patient is unable to trim own nails related to nail dystrophy and/or mobility issues. Patient does  have a history of T2DM.    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  Right Foot: Pain with palpation of nails due to elongation and dystrophic growth.   Assessment:   1. Pain due to onychomycosis of toenails of both feet   2. Type 2 diabetes mellitus without complication, without long-term current use of insulin ([place])      Plan:  Patient was evaluated and treated and all questions answered.   #Onychomycosis with pain  -Nails palliatively debrided as below. -Educated on self-care  Procedure: Nail Debridement Rationale: Pain Type of Debridement: manual, sharp debridement. Instrumentation: Nail nipper, rotary burr. Number of Nails: 10  Return in about 3 months (around 04/29/2023) for [facility].         Everitt Amber, DPM [facility]

## 2023-01-30 DIAGNOSIS — D519 Vitamin B12 deficiency anemia, unspecified: Secondary | ICD-10-CM | POA: Diagnosis not present

## 2023-03-06 DIAGNOSIS — D519 Vitamin B12 deficiency anemia, unspecified: Secondary | ICD-10-CM | POA: Diagnosis not present

## 2023-04-01 ENCOUNTER — Encounter (HOSPITAL_COMMUNITY): Payer: Self-pay

## 2023-04-01 ENCOUNTER — Emergency Department (HOSPITAL_COMMUNITY): Payer: Medicare PPO

## 2023-04-01 ENCOUNTER — Other Ambulatory Visit: Payer: Self-pay

## 2023-04-01 ENCOUNTER — Emergency Department (HOSPITAL_COMMUNITY)
Admission: EM | Admit: 2023-04-01 | Discharge: 2023-04-01 | Disposition: A | Discharge status: Home / Self Care | Payer: Medicare PPO | Attending: Emergency Medicine | Admitting: Emergency Medicine

## 2023-04-01 DIAGNOSIS — M25552 Pain in left hip: Secondary | ICD-10-CM | POA: Insufficient documentation

## 2023-04-01 DIAGNOSIS — Y92009 Unspecified place in unspecified non-institutional (private) residence as the place of occurrence of the external cause: Secondary | ICD-10-CM | POA: Insufficient documentation

## 2023-04-01 DIAGNOSIS — W19XXXA Unspecified fall, initial encounter: Secondary | ICD-10-CM

## 2023-04-01 DIAGNOSIS — M25562 Pain in left knee: Secondary | ICD-10-CM | POA: Diagnosis not present

## 2023-04-01 DIAGNOSIS — Z79899 Other long term (current) drug therapy: Secondary | ICD-10-CM | POA: Insufficient documentation

## 2023-04-01 DIAGNOSIS — I10 Essential (primary) hypertension: Secondary | ICD-10-CM | POA: Diagnosis not present

## 2023-04-01 DIAGNOSIS — W010XXA Fall on same level from slipping, tripping and stumbling without subsequent striking against object, initial encounter: Secondary | ICD-10-CM | POA: Diagnosis not present

## 2023-04-01 DIAGNOSIS — S79919A Unspecified injury of unspecified hip, initial encounter: Secondary | ICD-10-CM | POA: Diagnosis not present

## 2023-04-01 DIAGNOSIS — Z96642 Presence of left artificial hip joint: Secondary | ICD-10-CM | POA: Insufficient documentation

## 2023-04-01 DIAGNOSIS — M1712 Unilateral primary osteoarthritis, left knee: Secondary | ICD-10-CM | POA: Diagnosis not present

## 2023-04-01 MED ORDER — ACETAMINOPHEN 500 MG PO TABS
1000.0000 mg | ORAL_TABLET | Freq: Once | ORAL | Status: AC
  Administered 2023-04-01: 1000 mg via ORAL
  Filled 2023-04-01: qty 2

## 2023-04-01 NOTE — ED Triage Notes (Signed)
Mechanical fall today at home. Pt got up from her chair, and the next thing she knew she was on the floor. Tripped over red stool. Pt did not hit her head, and remembers the fall.  Left hip pain since the fall. Hx of hip replacement.

## 2023-04-01 NOTE — ED Provider Notes (Signed)
[facility] Provider Note   CSN: [id number] Arrival date & time: 04/01/23  2017     History  Chief Complaint  Patient presents with   Lytle Michaels    Michele Stewart is a 82 y.o. female PMH osteoarthritis of left hip s/p replacement approximately 8 years ago presenting after mechanical GLF earlier this evening at home when she tried to get out of her chair and tripped over the stool that was in front of it.  Daughter was present and attempted to catch her, but patient fell to the floor on her left side.  Patient remembers event, negative LOC, negative head injury.  She is complaining of left knee pain and left hip tenderness. Denies neck pain, back pain, headache, chest pain, shortness of breath, numbness or tingling of extremities.  Patient has not ambulated since the event.   Fall       Home Medications Prior to Admission medications   Medication Sig Start Date End Date Taking? Authorizing Provider  ACCU-CHEK AVIVA PLUS test strip Reported on 03/01/2016 11/20/15   [provider]  ACCU-CHEK SOFTCLIX LANCETS lancets  02/13/16   [provider]  alendronate (FOSAMAX) 70 MG tablet  02/06/16   [provider]  allopurinol (ZYLOPRIM) 100 MG tablet Take 1 tablet (100 mg total) by mouth daily. 04/25/16   Landis Martins, DPM  allopurinol (ZYLOPRIM) 300 MG tablet  04/24/20   [provider]  aspirin EC 325 MG EC tablet Take 1 tablet (325 mg total) by mouth 2 (two) times daily. Patient not taking: Reported on 03/01/2016 09/17/12   Leafy Kindle, PA-C  atorvastatin (LIPITOR) 20 MG tablet  05/05/20   [provider]  atorvastatin (LIPITOR) 40 MG tablet Take 40 mg by mouth daily.      [provider]  bisoprolol-hydrochlorothiazide (ZIAC) 10-6.25 MG tablet Take 1 tablet by mouth daily.    [provider]  Calcium Citrate-Vitamin D (CITRACAL + D PO) Take 2 tablets by mouth daily.    [provider]  Cholecalciferol (VITAMIN D-3) 5000 UNITS TABS Take 1 tablet by mouth daily.    [provider]  colchicine 0.6 MG tablet Take 1 tablet (0.6 mg total) by mouth 2 (two) times daily. 04/04/16   Landis Martins, DPM  dicyclomine (BENTYL) 20 MG tablet Take 20 mg by mouth 3 (three) times daily before meals.    [provider]  famotidine (PEPCID) 20 MG tablet Take 20 mg by mouth daily. Reported on 03/01/2016    [provider]  furosemide (LASIX) 80 MG tablet Take 80 mg by mouth daily.    [provider]  gemfibrozil (LOPID) 600 MG tablet  02/16/16   [provider]  hydrALAZINE (APRESOLINE) 25 MG tablet  03/01/20   [provider]  lisinopril (PRINIVIL,ZESTRIL) 20 MG tablet Take 20 mg by mouth daily.    [provider]  methocarbamol (ROBAXIN) 500 MG tablet Take 1 tablet (500 mg total) by mouth every 6 (six) hours as needed. Patient not taking: Reported on 03/01/2016 09/17/12   Leafy Kindle, PA-C  nitrofurantoin, macrocrystal-monohydrate, (MACROBID) 100 MG capsule  04/14/20   [provider]  Omega-3 Fatty Acids (FISH OIL PO) Take by mouth 2 (two) times daily. Reported on 03/01/2016    [provider]  pioglitazone (ACTOS) 30 MG tablet Take 30 mg by mouth daily.    [provider]  potassium chloride (KLOR-CON) 10 MEQ CR tablet Take 10 mEq  by mouth daily.      [provider]  PROLIA 60 MG/ML SOSY injection  03/22/20   [provider]  psyllium (REGULOID) 0.52 G capsule Take 0.52 g by mouth 3 (three) times daily. Reported on 03/01/2016    [provider]  valsartan (DIOVAN) 320 MG tablet Take 320 mg by mouth daily.      [provider]  vitamin E 1000 UNIT capsule Take 1,000 Units by mouth daily.    [provider]      Allergies    Penicillins, Shellfish allergy, and Sulfa antibiotics    Review of Systems   Review of Systems See HPI  Physical  Exam Updated Vital Signs BP (!) 130/47   Pulse 62   Temp 97.9 F (36.6 C) (Oral)   Resp 18   Ht 5\' 1"  (1.549 m)   Wt 91.8 kg   SpO2 94%   BMI 38.24 kg/m  Physical Exam Vitals and nursing note reviewed.  Constitutional:      General: She is not in acute distress.    Appearance: She is well-developed.  HENT:     Head: Normocephalic and atraumatic.  Eyes:     Conjunctiva/sclera: Conjunctivae normal.  Cardiovascular:     Rate and Rhythm: Normal rate and regular rhythm.     Heart sounds: No murmur heard. Pulmonary:     Effort: Pulmonary effort is normal. No respiratory distress.     Breath sounds: Normal breath sounds.  Abdominal:     General: Abdomen is flat. There is no distension.     Palpations: Abdomen is soft.     Tenderness: There is no abdominal tenderness.  Musculoskeletal:        General: Tenderness (Left knee and left hip) present. No swelling. Normal range of motion.     Cervical back: Normal range of motion and neck supple. No rigidity or tenderness.     Comments: No CTL spinal tenderness Due to pain, difficulty lifting left leg  Skin:    General: Skin is warm and dry.     Capillary Refill: Capillary refill takes less than 2 seconds.     Findings: No bruising or lesion.  Neurological:     Mental Status: She is alert and oriented to person, place, and time.     Cranial Nerves: No cranial nerve deficit.     Sensory: No sensory deficit.     Motor: No weakness.  Psychiatric:        Mood and Affect: Mood normal.     ED Results / Procedures / Treatments     Radiology DG Knee 1-2 Views Left  Result Date: 04/01/2023 CLINICAL DATA:  Fall EXAM: LEFT KNEE - 1-2 VIEW COMPARISON:  None Available. FINDINGS: Normal alignment. 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 Lateral examination is limited by obliquity, however, no definite effusion. Soft tissues are unremarkable. IMPRESSION: 1. 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  Electronically Signed   By: Fidela Salisbury M.D.   On: 04/01/2023 21:46   DG Hip Unilat With Pelvis 2-3 Views Left  Result Date: 04/01/2023 CLINICAL DATA:  Fall with left hip pain. EXAM: DG HIP (WITH OR WITHOUT PELVIS) 2-3V LEFT COMPARISON:  None Available. FINDINGS: There is mild osteopenia without evidence of fractures. There is a left hip total joint replacement without evidence of significant loosening. No dislocation is seen. No pelvic fracture or diastasis is evident. There is mild degenerative arthrosis of the right hip. The SI joints are unremarkable, as visualized.  Degenerative changes are noted of the visualized lower lumbar spine. There is a 1.7 cm calcification superimposing adjacent the lateral aspect of the left iliac wing, which probably represents a superimposed dystrophic calcified injection granuloma or could be due to other dystrophic posttraumatic calcification or foreign body reaction. IMPRESSION: 1. Osteopenia and degenerative change without evidence of fractures. 2. 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 3. 1.7 cm calcification superimposing adjacent the lateral aspect of the left iliac wing, probably a superimposed dystrophic calcified injection granuloma or other dystrophic calcification versus foreign body reaction. Electronically Signed   By: Telford Nab M.D.   On: 04/01/2023 21:28   Medications Ordered in ED Medications  acetaminophen (TYLENOL) tablet 1,000 mg (1,000 mg Oral Given 04/01/23 2132)    ED Course/ Medical Decision Making/ A&P Clinical Course as of 04/01/23 2242  Mon Apr 01, 2023  2141 Home after xray [CO]    Clinical Course User Index [CO] Bradd Canary, MD                             Medical Decision Making Amount and/or Complexity of Data Reviewed Radiology: ordered.  Risk OTC drugs.   Patient presented hemodynamically stable.  On exam she has tenderness palpation over medial left knee with restricted range of  motion due to pain.  She also has some tenderness over the left hip with difficulty with hip flexion due to pain.  Limb is neurovascularly intact.  She has no neck pain, back pain, no LOC or head injury and patient remembers entire event and it was witnessed, no need for head or neck imaging at this time.  X-ray of left hip and knee were both negative for signs of acute fracture.  She was given Tylenol in the emergency department.  She is deemed safe for discharge, has follow-up next week for her primary care physician.  She was told to schedule Tylenol and Motrin for the next 3 to 5 days and can use Voltaren gel or heating pad as needed, which she has at home.  She was given return precautions and expressed understanding.          Final Clinical Impression(s) / ED Diagnoses Final diagnoses:  Fall, initial encounter     Bradd Canary, MD 04/01/23 2316    Tegeler, Gwenyth Allegra, MD 04/03/23 [phone]

## 2023-04-01 NOTE — Discharge Instructions (Addendum)
Your x-rays today showed no signs of fracture or hardware damage.  You are given Tylenol in the emergency department.  You should schedule Tylenol 1000 mg 3 times a day and you can schedule ibuprofen 800 mg 3 times a day for the next 3 to 5 days to help with soreness.  If you have redness, swelling, worsening pain, ability to walk, numbness or tingling in your legs, fever or intractable vomiting, shortness of breath or chest pain please return to the emergency department.  Otherwise, you can follow-up with your primary care physician in a few days to ensure that you are healing well.

## 2023-04-05 ENCOUNTER — Telehealth: Payer: Self-pay

## 2023-04-05 NOTE — Telephone Encounter (Signed)
        Patient  visited [facility] on 04/01/2023  for fall.   Telephone encounter attempt :  1st  A HIPAA compliant voice message was left requesting a return call.  Instructed patient to call back at [phone].   Issiac Jamar [facility]  [facility] Population Health Community Resource Care Guide   [email or web address]  ?? [phone]   Website: [email or web address]

## 2023-04-08 ENCOUNTER — Telehealth: Payer: Self-pay

## 2023-04-08 NOTE — Telephone Encounter (Signed)
     Patient  visit on 04/01/2023  at [name] was for fall.  Have you been able to follow up with your primary care physician? Yes  The patient was or was not able to obtain any needed medicine or equipment. No medication prescribed.  Are there diet recommendations that you are having difficulty following? No  Patient expresses understanding of discharge instructions and education provided has no other needs at this time. Yes   Michele Stewart [facility]  [facility] Population Health Community Resource Care Guide   [email or web address]  ?? [phone]   Website: [email or web address]  [email or web address]

## 2023-04-10 DIAGNOSIS — D519 Vitamin B12 deficiency anemia, unspecified: Secondary | ICD-10-CM | POA: Diagnosis not present

## 2023-04-18 DIAGNOSIS — M109 Gout, unspecified: Secondary | ICD-10-CM | POA: Diagnosis not present

## 2023-04-18 DIAGNOSIS — E1129 Type 2 diabetes mellitus with other diabetic kidney complication: Secondary | ICD-10-CM | POA: Diagnosis not present

## 2023-04-18 DIAGNOSIS — I129 Hypertensive chronic kidney disease with stage 1 through stage 4 chronic kidney disease, or unspecified chronic kidney disease: Secondary | ICD-10-CM | POA: Diagnosis not present

## 2023-04-18 DIAGNOSIS — D519 Vitamin B12 deficiency anemia, unspecified: Secondary | ICD-10-CM | POA: Diagnosis not present

## 2023-04-18 DIAGNOSIS — N1832 Chronic kidney disease, stage 3b: Secondary | ICD-10-CM | POA: Diagnosis not present

## 2023-04-18 DIAGNOSIS — D638 Anemia in other chronic diseases classified elsewhere: Secondary | ICD-10-CM | POA: Diagnosis not present

## 2023-04-18 DIAGNOSIS — E785 Hyperlipidemia, unspecified: Secondary | ICD-10-CM | POA: Diagnosis not present

## 2023-04-19 NOTE — Telephone Encounter (Signed)
Done

## 2023-04-29 ENCOUNTER — Ambulatory Visit: Payer: Medicare PPO | Admitting: Podiatry

## 2023-04-29 DIAGNOSIS — M79674 Pain in right toe(s): Secondary | ICD-10-CM

## 2023-04-29 DIAGNOSIS — E119 Type 2 diabetes mellitus without complications: Secondary | ICD-10-CM | POA: Diagnosis not present

## 2023-04-29 DIAGNOSIS — M79675 Pain in left toe(s): Secondary | ICD-10-CM

## 2023-04-29 DIAGNOSIS — B351 Tinea unguium: Secondary | ICD-10-CM | POA: Diagnosis not present

## 2023-04-29 DIAGNOSIS — Z7984 Long term (current) use of oral hypoglycemic drugs: Secondary | ICD-10-CM

## 2023-04-29 NOTE — Progress Notes (Signed)
  Subjective:  Patient ID: Michele Stewart, female    DOB: [birth year],  MRN: [id number]  Chief Complaint  Patient presents with   Nail Problem    Diabetic Foot Care    82 y.o. female presents with the above complaint. History confirmed with patient. Patient presenting with pain related to dystrophic thickened elongated nails. Patient is unable to trim own nails related to nail dystrophy and/or mobility issues. Patient does  have a history of T2DM.    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  Right Foot: Pain with palpation of nails due to elongation and dystrophic growth.   Assessment:   1. Pain due to onychomycosis of toenails of both feet   2. Diabetes mellitus treated with oral medication ([facility])   3. Type 2 diabetes mellitus without complication, without long-term current use of insulin ([facility])       Plan:  Patient was evaluated and treated and all questions answered.   #Onychomycosis with pain  -Nails palliatively debrided as below. -Educated on self-care  Procedure: Nail Debridement Rationale: Pain Type of Debridement: manual, sharp debridement. Instrumentation: Nail nipper, rotary burr. Number of Nails: 10  Return in about 3 months (around 07/29/2023) for [facility].         Corinna Gab, DPM [facility]

## 2023-05-22 DIAGNOSIS — D519 Vitamin B12 deficiency anemia, unspecified: Secondary | ICD-10-CM | POA: Diagnosis not present

## 2023-06-05 NOTE — Progress Notes (Signed)
[facility]  [address] [place],  [place]  [address] [phone]  Clinic Day:  06/06/2023  Referring physician: Paulina Fusi, MD  HISTORY OF PRESENT ILLNESS:  The patient is an 82 y.o. female with anemia secondary to chronic renal insufficiency.  The patient has not received any red cell shots in years as her hemoglobin has consistently been near or above 10.  She comes in today for routine follow-up.  Since her last visit, the patient has been doing okay.  She denies having increased fatigue or any overt forms of blood loss which concern her for progressive anemia.    PHYSICAL EXAM:  Pulse 66, temperature 98.7 F (37.1 C), resp. rate 16, height 5\' 1"  (1.549 m), weight 198 lb 8 oz (90 kg), SpO2 96 %. Wt Readings from Last 3 Encounters:  06/06/23 198 lb 8 oz (90 kg)  04/01/23 202 lb 6.1 oz (91.8 kg)  12/04/22 202 lb 6.4 oz (91.8 kg)   Body mass index is 37.51 kg/m. Performance status (ECOG): 1 - Symptomatic but completely ambulatory Physical Exam Constitutional:      Appearance: Normal appearance. She is not ill-appearing.  HENT:     Mouth/Throat:     Mouth: Mucous membranes are moist.     Pharynx: Oropharynx is clear. No oropharyngeal exudate or posterior oropharyngeal erythema.  Cardiovascular:     Rate and Rhythm: Regular rhythm. Bradycardia present.     Heart sounds: No murmur heard.    No friction rub. No gallop.  Pulmonary:     Effort: Pulmonary effort is normal. No respiratory distress.     Breath sounds: Normal breath sounds. No wheezing, rhonchi or rales.  Abdominal:     General: Bowel sounds are normal. There is no distension.     Palpations: Abdomen is soft. There is no mass.     Tenderness: There is no abdominal tenderness.  Musculoskeletal:        General: No swelling.     Right lower leg: No edema.     Left lower leg: No edema.  Lymphadenopathy:     Cervical: No cervical adenopathy.     Upper Body:     Right upper  body: No supraclavicular or axillary adenopathy.     Left upper body: No supraclavicular or axillary adenopathy.     Lower Body: No right inguinal adenopathy. No left inguinal adenopathy.  Skin:    General: Skin is warm.     Coloration: Skin is not jaundiced.     Findings: No lesion or rash.  Neurological:     General: No focal deficit present.     Mental Status: She is alert and oriented to person, place, and time. Mental status is at baseline.  Psychiatric:        Mood and Affect: Mood normal.        Behavior: Behavior normal.        Thought Content: Thought content normal.     LABS:      Latest Ref Rng & Units 06/06/2023   12:00 AM 12/04/2022   12:00 AM 06/04/2022   12:00 AM  CBC  WBC  4.3     4.7     4.6      Hemoglobin 12.0 - 16.0 9.8     10.1     10.1      Hematocrit 36 - 46 30     30     31       Platelets  150 - 400 K/uL 126     150     147         This result is from an external source.      Latest Ref Rng & Units 06/06/2023    4:06 PM 12/04/2022    3:41 PM 06/04/2022   12:00 AM  CMP  Glucose 70 - 99 mg/dL 811  914    BUN 8 - 23 mg/dL 26  21  28       Creatinine 0.44 - 1.00 mg/dL 7.82  9.56  1.2      Sodium 135 - 145 mmol/L 138  141  139      Potassium 3.5 - 5.1 mmol/L 3.1  3.6  3.5      Chloride 98 - 111 mmol/L 99  104  98      CO2 22 - 32 mmol/L 28  29  33      Calcium 8.9 - 10.3 mg/dL 8.8  9.0  8.9      Total Protein 6.5 - 8.1 g/dL 6.5  6.7    Total Bilirubin 0.3 - 1.2 mg/dL 0.5  0.5    Alkaline Phos 38 - 126 U/L 53  80  94      AST 15 - 41 U/L 24  29  35      ALT 0 - 44 U/L 12  16  18          This result is from an external source.    Latest Reference Range & Units 06/06/23 16:06 06/06/23 16:58  Iron 28 - 170 ug/dL 71   UIBC ug/dL 213   TIBC 086 - 578 ug/dL 469   Saturation Ratios 10.4 - 31.8 % 22   Ferritin 11 - 307 ng/mL 72   Folate >5.9 ng/mL  13.0  Vitamin B12 180 - 914 pg/mL  663    ASSESSMENT & PLAN:  Assessment/Plan:  An 82 y.o. female with  anemia secondary to chronic renal insufficiency.  For the first time in years, her hemoglobin is below 10.  However, she remains without having any nutritional deficiencies.  Despite her hemoglobin being below 10, she is not interested in restarting Retacrit shots at this time; she wishes for her hemoglobin to continue to be followed conservatively.  I will acquiesce to her wishes and will see her back in 4 months for repeat clinical assessment. The patient understands all the plans discussed today and is in agreement with them.    Makinzy Cleere Kirby Funk, MD

## 2023-06-06 ENCOUNTER — Other Ambulatory Visit: Payer: Self-pay

## 2023-06-06 ENCOUNTER — Inpatient Hospital Stay: Payer: Medicare PPO | Admitting: Oncology

## 2023-06-06 ENCOUNTER — Other Ambulatory Visit: Payer: Self-pay | Admitting: Oncology

## 2023-06-06 ENCOUNTER — Inpatient Hospital Stay: Payer: Medicare PPO | Attending: Oncology

## 2023-06-06 VITALS — HR 66 | Temp 98.7°F | Resp 16 | Ht 61.0 in | Wt 198.5 lb

## 2023-06-06 DIAGNOSIS — D631 Anemia in chronic kidney disease: Secondary | ICD-10-CM

## 2023-06-06 DIAGNOSIS — D649 Anemia, unspecified: Secondary | ICD-10-CM | POA: Diagnosis not present

## 2023-06-06 DIAGNOSIS — N189 Chronic kidney disease, unspecified: Secondary | ICD-10-CM | POA: Diagnosis not present

## 2023-06-06 LAB — CBC AND DIFFERENTIAL
HCT: 30 — AB (ref 36–46)
Hemoglobin: 9.8 — AB (ref 12.0–16.0)
Neutrophils Absolute: 2.49
Platelets: 126 10*3/uL — AB (ref 150–400)
WBC: 4.3

## 2023-06-06 LAB — CMP (CANCER CENTER ONLY)
ALT: 12 U/L (ref 0–44)
AST: 24 U/L (ref 15–41)
Albumin: 3.3 g/dL — ABNORMAL LOW (ref 3.5–5.0)
Alkaline Phosphatase: 53 U/L (ref 38–126)
Anion gap: 11 (ref 5–15)
BUN: 26 mg/dL — ABNORMAL HIGH (ref 8–23)
CO2: 28 mmol/L (ref 22–32)
Calcium: 8.8 mg/dL — ABNORMAL LOW (ref 8.9–10.3)
Chloride: 99 mmol/L (ref 98–111)
Creatinine: 1.43 mg/dL — ABNORMAL HIGH (ref 0.44–1.00)
GFR, Estimated: 37 mL/min — ABNORMAL LOW (ref >60)
Glucose, Bld: 117 mg/dL — ABNORMAL HIGH (ref 70–99)
Potassium: 3.1 mmol/L — ABNORMAL LOW (ref 3.5–5.1)
Sodium: 138 mmol/L (ref 135–145)
Total Bilirubin: 0.5 mg/dL (ref 0.3–1.2)
Total Protein: 6.5 g/dL (ref 6.5–8.1)

## 2023-06-06 LAB — IRON AND TIBC
Iron: 71 ug/dL (ref 28–170)
Saturation Ratios: 22 % (ref 10.4–31.8)
TIBC: 316 ug/dL (ref 250–450)
UIBC: 245 ug/dL

## 2023-06-06 LAB — FERRITIN: Ferritin: 72 ng/mL (ref 11–307)

## 2023-06-06 LAB — CBC: RBC: 2.89 — AB (ref 3.87–5.11)

## 2023-06-07 ENCOUNTER — Telehealth: Payer: Self-pay

## 2023-06-07 LAB — VITAMIN B12: Vitamin B-12: 663 pg/mL (ref 180–914)

## 2023-06-07 LAB — FOLATE: Folate: 13 ng/mL (ref >5.9)

## 2023-06-07 NOTE — Telephone Encounter (Signed)
Latest Reference Range & Units 06/06/23 16:06 06/06/23 16:58  Iron 28 - 170 ug/dL 71   UIBC ug/dL 161   TIBC 096 - 045 ug/dL 409   Saturation Ratios 10.4 - 31.8 % 22   Ferritin 11 - 307 ng/mL 72   Folate >5.9 ng/mL  13.0  Vitamin B12 180 - 914 pg/mL  663

## 2023-06-26 DIAGNOSIS — M810 Age-related osteoporosis without current pathological fracture: Secondary | ICD-10-CM | POA: Diagnosis not present

## 2023-06-26 DIAGNOSIS — R600 Localized edema: Secondary | ICD-10-CM | POA: Diagnosis not present

## 2023-06-26 DIAGNOSIS — M1712 Unilateral primary osteoarthritis, left knee: Secondary | ICD-10-CM | POA: Diagnosis not present

## 2023-06-26 DIAGNOSIS — D519 Vitamin B12 deficiency anemia, unspecified: Secondary | ICD-10-CM | POA: Diagnosis not present

## 2023-07-22 DIAGNOSIS — D638 Anemia in other chronic diseases classified elsewhere: Secondary | ICD-10-CM | POA: Diagnosis not present

## 2023-07-22 DIAGNOSIS — E785 Hyperlipidemia, unspecified: Secondary | ICD-10-CM | POA: Diagnosis not present

## 2023-07-22 DIAGNOSIS — M109 Gout, unspecified: Secondary | ICD-10-CM | POA: Diagnosis not present

## 2023-07-22 DIAGNOSIS — E1129 Type 2 diabetes mellitus with other diabetic kidney complication: Secondary | ICD-10-CM | POA: Diagnosis not present

## 2023-07-22 DIAGNOSIS — N1832 Chronic kidney disease, stage 3b: Secondary | ICD-10-CM | POA: Diagnosis not present

## 2023-07-22 DIAGNOSIS — I129 Hypertensive chronic kidney disease with stage 1 through stage 4 chronic kidney disease, or unspecified chronic kidney disease: Secondary | ICD-10-CM | POA: Diagnosis not present

## 2023-07-22 DIAGNOSIS — D519 Vitamin B12 deficiency anemia, unspecified: Secondary | ICD-10-CM | POA: Diagnosis not present

## 2023-07-22 DIAGNOSIS — M810 Age-related osteoporosis without current pathological fracture: Secondary | ICD-10-CM | POA: Diagnosis not present

## 2023-07-31 DIAGNOSIS — E538 Deficiency of other specified B group vitamins: Secondary | ICD-10-CM | POA: Diagnosis not present

## 2023-08-07 ENCOUNTER — Encounter: Payer: Self-pay | Admitting: Internal Medicine

## 2023-08-26 ENCOUNTER — Ambulatory Visit (INDEPENDENT_AMBULATORY_CARE_PROVIDER_SITE_OTHER): Payer: Medicare PPO | Admitting: Podiatry

## 2023-08-26 DIAGNOSIS — B351 Tinea unguium: Secondary | ICD-10-CM | POA: Diagnosis not present

## 2023-08-26 DIAGNOSIS — E119 Type 2 diabetes mellitus without complications: Secondary | ICD-10-CM

## 2023-08-26 DIAGNOSIS — M79675 Pain in left toe(s): Secondary | ICD-10-CM

## 2023-08-26 DIAGNOSIS — M79674 Pain in right toe(s): Secondary | ICD-10-CM

## 2023-08-26 NOTE — Progress Notes (Signed)
  Subjective:  Patient ID: Michele Stewart, female    DOB: 29-Jan-1941,  MRN: [id number]  Chief Complaint  Patient presents with   Nail Problem    Diabetic Foot Care-nail trim     82 y.o. female presents with the above complaint. History confirmed with patient. Patient presenting with pain related to dystrophic thickened elongated nails. Patient is unable to trim own nails related to nail dystrophy and/or mobility issues. Patient does  have a history of T2DM.    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  Right Foot: Pain with palpation of nails due to elongation and dystrophic growth.   Assessment:   1. Pain due to onychomycosis of toenails of both feet   2. Type 2 diabetes mellitus without complication, without long-term current use of insulin (HCC)      Plan:  Patient was evaluated and treated and all questions answered.   #Onychomycosis with pain  -Nails palliatively debrided as below. -Educated on self-care  Procedure: Nail Debridement Rationale: Pain Type of Debridement: manual, sharp debridement. Instrumentation: Nail nipper, rotary burr. Number of Nails: 10  Return in about 3 months (around 11/26/2023) for [facility].         Corinna Gab, DPM [facility] / [facility]

## 2023-09-04 DIAGNOSIS — D519 Vitamin B12 deficiency anemia, unspecified: Secondary | ICD-10-CM | POA: Diagnosis not present

## 2023-10-06 NOTE — Progress Notes (Deleted)
[facility]  [address] [place],  [place]  [address] [phone]  Clinic Day:  06/06/2023  Referring physician: Paulina Fusi, MD  HISTORY OF PRESENT ILLNESS:  The patient is an 82 y.o. female with anemia secondary to chronic renal insufficiency.  The patient has not received any red cell shots in years as her hemoglobin has consistently been near or above 10.  She comes in today for routine follow-up.  Since her last visit, the patient has been doing okay.  She denies having increased fatigue or any overt forms of blood loss which concern her for progressive anemia.    PHYSICAL EXAM:  There were no vitals taken for this visit. Wt Readings from Last 3 Encounters:  06/06/23 198 lb 8 oz (90 kg)  04/01/23 202 lb 6.1 oz (91.8 kg)  12/04/22 202 lb 6.4 oz (91.8 kg)   There is no height or weight on file to calculate BMI. Performance status (ECOG): 1 - Symptomatic but completely ambulatory Physical Exam Constitutional:      Appearance: Normal appearance. She is not ill-appearing.  HENT:     Mouth/Throat:     Mouth: Mucous membranes are moist.     Pharynx: Oropharynx is clear. No oropharyngeal exudate or posterior oropharyngeal erythema.  Cardiovascular:     Rate and Rhythm: Regular rhythm. Bradycardia present.     Heart sounds: No murmur heard.    No friction rub. No gallop.  Pulmonary:     Effort: Pulmonary effort is normal. No respiratory distress.     Breath sounds: Normal breath sounds. No wheezing, rhonchi or rales.  Abdominal:     General: Bowel sounds are normal. There is no distension.     Palpations: Abdomen is soft. There is no mass.     Tenderness: There is no abdominal tenderness.  Musculoskeletal:        General: No swelling.     Right lower leg: No edema.     Left lower leg: No edema.  Lymphadenopathy:     Cervical: No cervical adenopathy.     Upper Body:     Right upper body: No supraclavicular or axillary adenopathy.      Left upper body: No supraclavicular or axillary adenopathy.     Lower Body: No right inguinal adenopathy. No left inguinal adenopathy.  Skin:    General: Skin is warm.     Coloration: Skin is not jaundiced.     Findings: No lesion or rash.  Neurological:     General: No focal deficit present.     Mental Status: She is alert and oriented to person, place, and time. Mental status is at baseline.  Psychiatric:        Mood and Affect: Mood normal.        Behavior: Behavior normal.        Thought Content: Thought content normal.    LABS:      Latest Ref Rng & Units 06/06/2023   12:00 AM 12/04/2022   12:00 AM 06/04/2022   12:00 AM  CBC  WBC  4.3     4.7     4.6      Hemoglobin 12.0 - 16.0 9.8     10.1     10.1      Hematocrit 36 - 46 30     30     31       Platelets 150 - 400 K/uL 126     150  147         This result is from an external source.      Latest Ref Rng & Units 06/06/2023    4:06 PM 12/04/2022    3:41 PM 06/04/2022   12:00 AM  CMP  Glucose 70 - 99 mg/dL 664  403    BUN 8 - 23 mg/dL 26  21  28       Creatinine 0.44 - 1.00 mg/dL 4.74  2.59  1.2      Sodium 135 - 145 mmol/L 138  141  139      Potassium 3.5 - 5.1 mmol/L 3.1  3.6  3.5      Chloride 98 - 111 mmol/L 99  104  98      CO2 22 - 32 mmol/L 28  29  33      Calcium 8.9 - 10.3 mg/dL 8.8  9.0  8.9      Total Protein 6.5 - 8.1 g/dL 6.5  6.7    Total Bilirubin 0.3 - 1.2 mg/dL 0.5  0.5    Alkaline Phos 38 - 126 U/L 53  80  94      AST 15 - 41 U/L 24  29  35      ALT 0 - 44 U/L 12  16  18          This result is from an external source.    Latest Reference Range & Units 06/06/23 16:06 06/06/23 16:58  Iron 28 - 170 ug/dL 71   UIBC ug/dL 563   TIBC 875 - 643 ug/dL 329   Saturation Ratios 10.4 - 31.8 % 22   Ferritin 11 - 307 ng/mL 72   Folate >5.9 ng/mL  13.0  Vitamin B12 180 - 914 pg/mL  663    ASSESSMENT & PLAN:  Assessment/Plan:  An 82 y.o. female with anemia secondary to chronic renal insufficiency.  For the  first time in years, her hemoglobin is below 10.  However, she remains without having any nutritional deficiencies.  Despite her hemoglobin being below 10, she is not interested in restarting Retacrit shots at this time; she wishes for her hemoglobin to continue to be followed conservatively.  I will acquiesce to her wishes and will see her back in 4 months for repeat clinical assessment. The patient understands all the plans discussed today and is in agreement with them.    Roderick Calo Kirby Funk, MD

## 2023-10-07 ENCOUNTER — Inpatient Hospital Stay: Payer: Medicare PPO | Attending: Oncology

## 2023-10-07 ENCOUNTER — Inpatient Hospital Stay: Payer: Medicare PPO | Admitting: Oncology

## 2023-10-09 DIAGNOSIS — D519 Vitamin B12 deficiency anemia, unspecified: Secondary | ICD-10-CM | POA: Diagnosis not present

## 2023-10-22 DIAGNOSIS — D638 Anemia in other chronic diseases classified elsewhere: Secondary | ICD-10-CM | POA: Diagnosis not present

## 2023-10-22 DIAGNOSIS — N1832 Chronic kidney disease, stage 3b: Secondary | ICD-10-CM | POA: Diagnosis not present

## 2023-10-22 DIAGNOSIS — E1129 Type 2 diabetes mellitus with other diabetic kidney complication: Secondary | ICD-10-CM | POA: Diagnosis not present

## 2023-10-22 DIAGNOSIS — D519 Vitamin B12 deficiency anemia, unspecified: Secondary | ICD-10-CM | POA: Diagnosis not present

## 2023-10-22 DIAGNOSIS — Z23 Encounter for immunization: Secondary | ICD-10-CM | POA: Diagnosis not present

## 2023-10-22 DIAGNOSIS — I129 Hypertensive chronic kidney disease with stage 1 through stage 4 chronic kidney disease, or unspecified chronic kidney disease: Secondary | ICD-10-CM | POA: Diagnosis not present

## 2023-10-22 DIAGNOSIS — M109 Gout, unspecified: Secondary | ICD-10-CM | POA: Diagnosis not present

## 2023-10-22 DIAGNOSIS — E785 Hyperlipidemia, unspecified: Secondary | ICD-10-CM | POA: Diagnosis not present

## 2023-10-22 DIAGNOSIS — M810 Age-related osteoporosis without current pathological fracture: Secondary | ICD-10-CM | POA: Diagnosis not present

## 2023-10-28 DIAGNOSIS — E1129 Type 2 diabetes mellitus with other diabetic kidney complication: Secondary | ICD-10-CM | POA: Diagnosis not present

## 2023-10-28 DIAGNOSIS — D638 Anemia in other chronic diseases classified elsewhere: Secondary | ICD-10-CM | POA: Diagnosis not present

## 2023-10-28 DIAGNOSIS — E785 Hyperlipidemia, unspecified: Secondary | ICD-10-CM | POA: Diagnosis not present

## 2023-10-28 DIAGNOSIS — M109 Gout, unspecified: Secondary | ICD-10-CM | POA: Diagnosis not present

## 2023-10-28 DIAGNOSIS — D519 Vitamin B12 deficiency anemia, unspecified: Secondary | ICD-10-CM | POA: Diagnosis not present

## 2023-11-13 DIAGNOSIS — D519 Vitamin B12 deficiency anemia, unspecified: Secondary | ICD-10-CM | POA: Diagnosis not present

## 2023-11-25 DIAGNOSIS — J208 Acute bronchitis due to other specified organisms: Secondary | ICD-10-CM | POA: Diagnosis not present

## 2023-11-25 DIAGNOSIS — B9689 Other specified bacterial agents as the cause of diseases classified elsewhere: Secondary | ICD-10-CM | POA: Diagnosis not present

## 2023-11-26 ENCOUNTER — Ambulatory Visit: Payer: Medicare PPO | Admitting: Podiatry

## 2023-12-03 ENCOUNTER — Ambulatory Visit: Payer: Medicare PPO | Admitting: Podiatry

## 2023-12-18 DIAGNOSIS — B9689 Other specified bacterial agents as the cause of diseases classified elsewhere: Secondary | ICD-10-CM | POA: Diagnosis not present

## 2023-12-18 DIAGNOSIS — J208 Acute bronchitis due to other specified organisms: Secondary | ICD-10-CM | POA: Diagnosis not present

## 2023-12-18 DIAGNOSIS — J209 Acute bronchitis, unspecified: Secondary | ICD-10-CM | POA: Diagnosis not present

## 2023-12-18 DIAGNOSIS — D519 Vitamin B12 deficiency anemia, unspecified: Secondary | ICD-10-CM | POA: Diagnosis not present

## 2023-12-18 DIAGNOSIS — J90 Pleural effusion, not elsewhere classified: Secondary | ICD-10-CM | POA: Diagnosis not present

## 2024-01-03 DIAGNOSIS — M25512 Pain in left shoulder: Secondary | ICD-10-CM | POA: Diagnosis not present

## 2024-01-03 DIAGNOSIS — M19012 Primary osteoarthritis, left shoulder: Secondary | ICD-10-CM | POA: Diagnosis not present

## 2024-01-03 DIAGNOSIS — J208 Acute bronchitis due to other specified organisms: Secondary | ICD-10-CM | POA: Diagnosis not present

## 2024-01-06 ENCOUNTER — Ambulatory Visit: Payer: Medicare PPO | Admitting: Podiatry

## 2024-01-29 DIAGNOSIS — D519 Vitamin B12 deficiency anemia, unspecified: Secondary | ICD-10-CM | POA: Diagnosis not present

## 2024-01-29 DIAGNOSIS — M109 Gout, unspecified: Secondary | ICD-10-CM | POA: Diagnosis not present

## 2024-01-29 DIAGNOSIS — E785 Hyperlipidemia, unspecified: Secondary | ICD-10-CM | POA: Diagnosis not present

## 2024-01-29 DIAGNOSIS — M810 Age-related osteoporosis without current pathological fracture: Secondary | ICD-10-CM | POA: Diagnosis not present

## 2024-01-29 DIAGNOSIS — E1129 Type 2 diabetes mellitus with other diabetic kidney complication: Secondary | ICD-10-CM | POA: Diagnosis not present

## 2024-01-29 DIAGNOSIS — D638 Anemia in other chronic diseases classified elsewhere: Secondary | ICD-10-CM | POA: Diagnosis not present

## 2024-01-31 DIAGNOSIS — E1129 Type 2 diabetes mellitus with other diabetic kidney complication: Secondary | ICD-10-CM | POA: Diagnosis not present

## 2024-01-31 DIAGNOSIS — M109 Gout, unspecified: Secondary | ICD-10-CM | POA: Diagnosis not present

## 2024-01-31 DIAGNOSIS — D638 Anemia in other chronic diseases classified elsewhere: Secondary | ICD-10-CM | POA: Diagnosis not present

## 2024-01-31 DIAGNOSIS — D519 Vitamin B12 deficiency anemia, unspecified: Secondary | ICD-10-CM | POA: Diagnosis not present

## 2024-01-31 DIAGNOSIS — I129 Hypertensive chronic kidney disease with stage 1 through stage 4 chronic kidney disease, or unspecified chronic kidney disease: Secondary | ICD-10-CM | POA: Diagnosis not present

## 2024-01-31 DIAGNOSIS — E785 Hyperlipidemia, unspecified: Secondary | ICD-10-CM | POA: Diagnosis not present

## 2024-01-31 DIAGNOSIS — M810 Age-related osteoporosis without current pathological fracture: Secondary | ICD-10-CM | POA: Diagnosis not present

## 2024-01-31 DIAGNOSIS — N1832 Chronic kidney disease, stage 3b: Secondary | ICD-10-CM | POA: Diagnosis not present

## 2024-02-10 ENCOUNTER — Ambulatory Visit: Payer: Medicare PPO | Admitting: Podiatry

## 2024-03-11 DIAGNOSIS — D519 Vitamin B12 deficiency anemia, unspecified: Secondary | ICD-10-CM | POA: Diagnosis not present

## 2024-04-15 DIAGNOSIS — D519 Vitamin B12 deficiency anemia, unspecified: Secondary | ICD-10-CM | POA: Diagnosis not present

## 2024-05-04 DIAGNOSIS — E1129 Type 2 diabetes mellitus with other diabetic kidney complication: Secondary | ICD-10-CM | POA: Diagnosis not present

## 2024-05-04 DIAGNOSIS — D519 Vitamin B12 deficiency anemia, unspecified: Secondary | ICD-10-CM | POA: Diagnosis not present

## 2024-05-04 DIAGNOSIS — M810 Age-related osteoporosis without current pathological fracture: Secondary | ICD-10-CM | POA: Diagnosis not present

## 2024-05-04 DIAGNOSIS — I129 Hypertensive chronic kidney disease with stage 1 through stage 4 chronic kidney disease, or unspecified chronic kidney disease: Secondary | ICD-10-CM | POA: Diagnosis not present

## 2024-05-04 DIAGNOSIS — D638 Anemia in other chronic diseases classified elsewhere: Secondary | ICD-10-CM | POA: Diagnosis not present

## 2024-05-04 DIAGNOSIS — N1832 Chronic kidney disease, stage 3b: Secondary | ICD-10-CM | POA: Diagnosis not present

## 2024-05-04 DIAGNOSIS — E785 Hyperlipidemia, unspecified: Secondary | ICD-10-CM | POA: Diagnosis not present

## 2024-05-04 DIAGNOSIS — M109 Gout, unspecified: Secondary | ICD-10-CM | POA: Diagnosis not present

## 2024-05-20 DIAGNOSIS — D519 Vitamin B12 deficiency anemia, unspecified: Secondary | ICD-10-CM | POA: Diagnosis not present

## 2024-06-24 DIAGNOSIS — D519 Vitamin B12 deficiency anemia, unspecified: Secondary | ICD-10-CM | POA: Diagnosis not present

## 2024-07-29 DIAGNOSIS — D519 Vitamin B12 deficiency anemia, unspecified: Secondary | ICD-10-CM | POA: Diagnosis not present

## 2024-08-03 DIAGNOSIS — D638 Anemia in other chronic diseases classified elsewhere: Secondary | ICD-10-CM | POA: Diagnosis not present

## 2024-08-03 DIAGNOSIS — E785 Hyperlipidemia, unspecified: Secondary | ICD-10-CM | POA: Diagnosis not present

## 2024-08-03 DIAGNOSIS — M109 Gout, unspecified: Secondary | ICD-10-CM | POA: Diagnosis not present

## 2024-08-03 DIAGNOSIS — M810 Age-related osteoporosis without current pathological fracture: Secondary | ICD-10-CM | POA: Diagnosis not present

## 2024-08-03 DIAGNOSIS — I872 Venous insufficiency (chronic) (peripheral): Secondary | ICD-10-CM | POA: Diagnosis not present

## 2024-08-03 DIAGNOSIS — E1129 Type 2 diabetes mellitus with other diabetic kidney complication: Secondary | ICD-10-CM | POA: Diagnosis not present

## 2024-08-03 DIAGNOSIS — I129 Hypertensive chronic kidney disease with stage 1 through stage 4 chronic kidney disease, or unspecified chronic kidney disease: Secondary | ICD-10-CM | POA: Diagnosis not present

## 2024-08-03 DIAGNOSIS — D519 Vitamin B12 deficiency anemia, unspecified: Secondary | ICD-10-CM | POA: Diagnosis not present

## 2024-08-03 DIAGNOSIS — N1832 Chronic kidney disease, stage 3b: Secondary | ICD-10-CM | POA: Diagnosis not present

## 2024-09-03 DIAGNOSIS — D519 Vitamin B12 deficiency anemia, unspecified: Secondary | ICD-10-CM | POA: Diagnosis not present

## 2024-09-03 DIAGNOSIS — M810 Age-related osteoporosis without current pathological fracture: Secondary | ICD-10-CM | POA: Diagnosis not present

## 2024-10-08 DIAGNOSIS — D519 Vitamin B12 deficiency anemia, unspecified: Secondary | ICD-10-CM | POA: Diagnosis not present

## 2024-10-20 DIAGNOSIS — E1129 Type 2 diabetes mellitus with other diabetic kidney complication: Secondary | ICD-10-CM | POA: Diagnosis not present

## 2024-10-20 DIAGNOSIS — E785 Hyperlipidemia, unspecified: Secondary | ICD-10-CM | POA: Diagnosis not present

## 2024-10-20 DIAGNOSIS — M810 Age-related osteoporosis without current pathological fracture: Secondary | ICD-10-CM | POA: Diagnosis not present

## 2024-10-20 DIAGNOSIS — Z23 Encounter for immunization: Secondary | ICD-10-CM | POA: Diagnosis not present

## 2024-10-20 DIAGNOSIS — I872 Venous insufficiency (chronic) (peripheral): Secondary | ICD-10-CM | POA: Diagnosis not present

## 2024-10-20 DIAGNOSIS — M109 Gout, unspecified: Secondary | ICD-10-CM | POA: Diagnosis not present

## 2024-10-20 DIAGNOSIS — I129 Hypertensive chronic kidney disease with stage 1 through stage 4 chronic kidney disease, or unspecified chronic kidney disease: Secondary | ICD-10-CM | POA: Diagnosis not present

## 2024-10-20 DIAGNOSIS — D638 Anemia in other chronic diseases classified elsewhere: Secondary | ICD-10-CM | POA: Diagnosis not present

## 2024-10-20 DIAGNOSIS — D519 Vitamin B12 deficiency anemia, unspecified: Secondary | ICD-10-CM | POA: Diagnosis not present

## 2024-10-20 DIAGNOSIS — N1832 Chronic kidney disease, stage 3b: Secondary | ICD-10-CM | POA: Diagnosis not present

## 2024-11-12 DIAGNOSIS — D519 Vitamin B12 deficiency anemia, unspecified: Secondary | ICD-10-CM | POA: Diagnosis not present
